# Patient Record
Sex: Male | Born: 1970 | Race: White | Hispanic: Yes | Marital: Married | Smoking: Never smoker
Health system: Southern US, Community
[De-identification: ages and names within clinical notes are randomized; demographics above are authoritative.]

## PROBLEM LIST (undated history)

## (undated) DIAGNOSIS — K648 Other hemorrhoids: Secondary | ICD-10-CM

## (undated) DIAGNOSIS — L719 Rosacea, unspecified: Secondary | ICD-10-CM

## (undated) DIAGNOSIS — IMO0001 Reserved for inherently not codable concepts without codable children: Secondary | ICD-10-CM

## (undated) DIAGNOSIS — H3530 Unspecified macular degeneration: Secondary | ICD-10-CM

## (undated) HISTORY — DX: Other hemorrhoids: K64.8

## (undated) HISTORY — DX: Rosacea, unspecified: L71.9

## (undated) HISTORY — DX: Unspecified macular degeneration: H35.30

## (undated) HISTORY — DX: Reserved for inherently not codable concepts without codable children: IMO0001

---
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
## 1996-02-10 HISTORY — PX: REFRACTIVE SURGERY: SHX103

## 2009-02-09 HISTORY — PX: VASECTOMY: SHX75

## 2010-03-10 ENCOUNTER — Encounter: Payer: Self-pay | Admitting: Internal Medicine

## 2010-03-10 ENCOUNTER — Ambulatory Visit
Admission: RE | Admit: 2010-03-10 | Discharge: 2010-03-10 | Payer: Self-pay | Source: Home / Self Care | Attending: Internal Medicine | Admitting: Internal Medicine

## 2010-03-10 DIAGNOSIS — L719 Rosacea, unspecified: Secondary | ICD-10-CM | POA: Insufficient documentation

## 2010-03-19 ENCOUNTER — Other Ambulatory Visit: Payer: Self-pay

## 2010-03-19 NOTE — Assessment & Plan Note (Signed)
Summary: NEW TO ESTAB///SPH   Vital Signs:  Patient profile:   40 year old male Height:      68.5 inches Weight:      172 pounds BMI:     25.87 O2 Sat:      97 % on Room air Temp:     98.7 degrees F oral Pulse rate:   70 / minute Resp:     16 per minute BP sitting:   90 / 62  (left arm)  Vitals Entered By: Jeremy Johann CMA (March 10, 2010 12:54 PM)  O2 Flow:  Room air CC: new to establish Comments no concerns   History of Present Illness: CPX  for years has episodic R suprapubic pain, usually associated w/ periods of inactivity or wt gain. Was inactive x few weeks, started w/ pain @ R suprapubic area few days ago, not severe  Preventive Screening-Counseling & Management  Alcohol-Tobacco     Alcohol drinks/day: <1     Smoking Status: never  Caffeine-Diet-Exercise     Does Patient Exercise: yes     Times/week: 4      Drug Use:  no.    Current Medications (verified): 1)  None  Allergies (verified): No Known Drug Allergies  Past History:  Past Medical History: Rosacea, usually increased by stress, usually Flagyl topical + by mouth abx   Past Surgical History: Appendectomy-1987 Vasectomy-2011 laser eye surgery-1998  Family History: DM-mom CAD--no Steward Drone colon ca--no prostate ca--no  Social History: original from [place] Married 2 children, [name]  2007 , [name] 2011 Never Smoked Alcohol use-yes Drug use-no Regular exercise--yes Smoking Status:  never Drug Use:  no Does Patient Exercise:  yes  Review of Systems General:  Denies fatigue, fever, and weight loss. CV:  Denies chest pain or discomfort and swelling of feet. Resp:  Denies cough, sputum productive, and wheezing. GI:  Denies abdominal pain, nausea, and vomiting; x 3 years, occasionally sees blood in stools,saw his previous MD, dx w/ small hemorrhoids . GU:  Denies dysuria, hematuria, and urinary hesitancy. Psych:  + recent stress .  Physical Exam  General:  alert,  well-developed, and well-nourished.   Neck:  no masses, no thyromegaly, and no thyroid nodules or tenderness.   Lungs:  normal respiratory effort, no intercostal retractions, no accessory muscle use, and normal breath sounds.   Heart:  normal rate, regular rhythm, no murmur, and no gallop.   Abdomen:  soft, non-tender, no distention, no masses, no guarding, no rigidity, no inguinal hernia, no hepatomegaly, and no splenomegaly.   Genitalia:  uncircumcised, no hydrocele, no varicocele, no cutaneous lesions, and no urethral discharge.     Impression & Recommendations:  Problem # 1:  ROUTINE GENERAL MEDICAL EXAM@HEALTH  CARE FACL (ICD-V70.0) Td  ~2009 flu shot, recommended, states he got it at his job diet-exercise discussed  labs  doing well  EKG WNL  Orders: EKG w/ Interpretation (93000)  Problem # 2:  ROSACEA (ICD-695.3) has rosacea , sporadically  uses flagyl topical +   abx by mouth (minociclin) Rx for flagyl provided , will try doxycycline instead of minociclin  Complete Medication List: 1)  Metronidazole 0.75 % Gel (Metronidazole) .... Apply two times a day for rosacea 2)  Doxycycline Monohydrate 50 Mg Caps (Doxycycline monohydrate) .Marland Kitchen.. 1 by mouth two times a day  Patient Instructions: 1)  came back fasting: 2)  FLP BMP CBC AST ALT TSH----dx V70 3)  Please schedule a follow-up appointment in 1 year.  Prescriptions: DOXYCYCLINE MONOHYDRATE  50 MG CAPS (DOXYCYCLINE MONOHYDRATE) 1 by mouth two times a day  #60 x 3   Entered and Authorized by:   Elita Quick E. Jerremy Maione MD   Signed by:   Nolon Rod. Cataleya Cristina MD on 03/10/2010   Method used:   Print then Give to Patient   RxID:   [id number] METRONIDAZOLE 0.75 % GEL (METRONIDAZOLE) apply two times a day for rosacea  #1 x 6   Entered and Authorized by:   Nolon Rod. Ellen Mayol MD   Signed by:   Nolon Rod. Artin Mceuen MD on 03/10/2010   Method used:   Print then Give to Patient   RxID:   [id number]    Orders Added: 1)  EKG w/ Interpretation [93000] 2)  New  Patient 18-39 years [99385]   Immunization History:  Influenza Immunization History:    Influenza:  rec (03/10/2010)  Tetanus/Td Immunization History:    Tetanus/Td:  historical (02/10/2007)   Immunization History:  Influenza Immunization History:    Influenza:  rec (03/10/2010)  Tetanus/Td Immunization History:    Tetanus/Td:  Historical (02/10/2007)

## 2010-06-05 ENCOUNTER — Encounter: Payer: Self-pay | Admitting: Internal Medicine

## 2010-06-05 ENCOUNTER — Telehealth: Payer: Self-pay | Admitting: Internal Medicine

## 2010-06-05 DIAGNOSIS — L719 Rosacea, unspecified: Secondary | ICD-10-CM

## 2010-06-05 NOTE — Telephone Encounter (Signed)
Pt called, needs dermatology referal. Arrange to see Dr Terri Piedra, Dx rosacea

## 2010-10-06 ENCOUNTER — Encounter: Payer: Self-pay | Admitting: Internal Medicine

## 2010-10-07 ENCOUNTER — Ambulatory Visit (INDEPENDENT_AMBULATORY_CARE_PROVIDER_SITE_OTHER): Payer: BC Managed Care – PPO | Admitting: Internal Medicine

## 2010-10-07 ENCOUNTER — Encounter: Payer: Self-pay | Admitting: Internal Medicine

## 2010-10-07 DIAGNOSIS — R197 Diarrhea, unspecified: Secondary | ICD-10-CM | POA: Insufficient documentation

## 2010-10-07 NOTE — Patient Instructions (Signed)
Take ALIGN (or similar probiotic) 1 tablet a day x 30 days  Bring stools samples: O&P , culture, C diff, WBCs Dx diarrhea  Call if not better in 1 month

## 2010-10-07 NOTE — Assessment & Plan Note (Addendum)
4 weeks ago had what seemed to be typical traveler's diarrhea, since then he has occasionally loose stools. Review of systems is otherwise essentially negative. This could be postinflammatory diarrhea, he also takes on and off antibiotics for acne: C diff?. Plan: Stool studies  probiotics Further workup if not better. As far as his family history of colon cancer, he will need colonoscopy 10 years before the index case. Patient will find out for sure the age of the diagnosis of colon cancer in his uncle

## 2010-10-07 NOTE — Progress Notes (Signed)
  Subjective:    Patient ID: Benjamin Burgess, male    DOB: [birth year], 40 y.o.   MRN: [id number]  HPI 4 weeks ago took a trip to [place] and [place], at the end of the trip he developed moderate to severe watery diarrhea without blood. Since then his stomach is not the same, has on-off loose stools without blood, loose stools aren usually immediately postprandial. Also concerned because one of his uncles was diagnosed with colon cancer at age 13-50  Past Medical History  Diagnosis Date  . Rosacea     Past Surgical History  Procedure Date  . Refractive surgery 1998  . Vasectomy 2011  . Appendectomy 1987     Review of Systems Denies any fever or weight loss Initially had some nausea which is now resolved. No blood in the stools No early satiety, had GERD type of symptoms twice last week but otherwise no GERD sx. No abdominal pain He takes on and off doxycycline for acne, last round of antibiotics about 6 weeks ago appear      Objective:   Physical Exam  Constitutional: He appears well-developed and well-nourished. No distress.  HENT:  Head: Normocephalic and atraumatic.  Eyes: No scleral icterus.  Cardiovascular: Normal rate, regular rhythm and normal heart sounds.   No murmur heard. Pulmonary/Chest: Effort normal and breath sounds normal. No respiratory distress. He has no wheezes. He has no rales.  Abdominal: Soft. Bowel sounds are normal. He exhibits no distension. There is no tenderness. There is no rebound and no guarding.  Musculoskeletal: He exhibits no edema.  Skin: He is not diaphoretic.          Assessment & Plan:

## 2010-10-08 ENCOUNTER — Other Ambulatory Visit: Payer: Self-pay | Admitting: Internal Medicine

## 2010-10-09 LAB — OVA AND PARASITE EXAMINATION

## 2010-10-09 LAB — CLOSTRIDIUM DIFFICILE EIA: CDIFTX: NEGATIVE

## 2010-10-12 LAB — STOOL CULTURE

## 2010-11-10 ENCOUNTER — Ambulatory Visit (INDEPENDENT_AMBULATORY_CARE_PROVIDER_SITE_OTHER): Payer: BC Managed Care – PPO | Admitting: Internal Medicine

## 2010-11-10 ENCOUNTER — Encounter: Payer: Self-pay | Admitting: Internal Medicine

## 2010-11-10 VITALS — BP 106/68 | HR 58 | Temp 98.4°F | Resp 16 | Wt 177.0 lb

## 2010-11-10 DIAGNOSIS — J069 Acute upper respiratory infection, unspecified: Secondary | ICD-10-CM

## 2010-11-10 MED ORDER — AMOXICILLIN 500 MG PO CAPS: 1000.0000 mg | ORAL_CAPSULE | Freq: Two times a day (BID) | ORAL | Status: AC

## 2010-11-10 NOTE — Patient Instructions (Signed)
If  cough, take Mucinex DM twice a day as needed  nasonex 2 puffs on each side of the nose daily until samples gone Take the antibiotic as prescribed if not getting better in few days (Amoxicillin) Call anytime if the symptoms are severe, you have high fever or not well in 10 days

## 2010-11-10 NOTE — Progress Notes (Signed)
  Subjective:    Patient ID: Benjamin Burgess, male    DOB: October 25, 1970, 40 y.o.   MRN: [id number]  HPI Sx started 6 days ago, mild ST, x 1 day has noted yellow nasal d/c , worried b/c  he is leaving town in 2 days  Past Medical History  Diagnosis Date  . Rosacea    Past Surgical History  Procedure Date  . Refractive surgery 1998  . Vasectomy 2011  . Appendectomy 1987    Review of Systems No F/C No N-V Mild nose congestion x 1 - 2 days No myalgias Wife and kids w/ URIs as well    Objective:   Physical Exam  Constitutional: He appears well-developed and well-nourished.  HENT:  Head: Normocephalic and atraumatic.  Right Ear: External ear normal.  Left Ear: External ear normal.       Nose congested, face NTTP, throat w/o redness or d/c   Cardiovascular: Normal rate, regular rhythm and normal heart sounds.   No murmur heard. Pulmonary/Chest: Effort normal and breath sounds normal. No respiratory distress. He has no wheezes. He has no rales.  Musculoskeletal: He exhibits no edema.      Assessment & Plan:  URI: See instructions Likes abx just in case (leaving town)

## 2010-11-15 ENCOUNTER — Other Ambulatory Visit: Payer: Self-pay | Admitting: Emergency Medicine

## 2010-11-15 ENCOUNTER — Ambulatory Visit (INDEPENDENT_AMBULATORY_CARE_PROVIDER_SITE_OTHER)
Admission: RE | Admit: 2010-11-15 | Discharge: 2010-11-15 | Disposition: A | Discharge status: Home / Self Care | Payer: BC Managed Care – PPO | Source: Ambulatory Visit | Attending: Emergency Medicine | Admitting: Emergency Medicine

## 2010-11-15 ENCOUNTER — Ambulatory Visit (HOSPITAL_BASED_OUTPATIENT_CLINIC_OR_DEPARTMENT_OTHER)
Admission: AD | Admit: 2010-11-15 | Discharge: 2010-11-15 | Disposition: A | Discharge status: Home / Self Care | Payer: BC Managed Care – PPO | Source: Ambulatory Visit | Attending: Emergency Medicine | Admitting: Emergency Medicine

## 2010-11-15 DIAGNOSIS — J019 Acute sinusitis, unspecified: Secondary | ICD-10-CM

## 2010-11-15 DIAGNOSIS — R209 Unspecified disturbances of skin sensation: Secondary | ICD-10-CM | POA: Insufficient documentation

## 2010-11-15 DIAGNOSIS — J329 Chronic sinusitis, unspecified: Secondary | ICD-10-CM

## 2010-11-15 DIAGNOSIS — R51 Headache: Secondary | ICD-10-CM

## 2010-11-28 ENCOUNTER — Ambulatory Visit (INDEPENDENT_AMBULATORY_CARE_PROVIDER_SITE_OTHER): Payer: BC Managed Care – PPO | Admitting: Internal Medicine

## 2010-11-28 ENCOUNTER — Encounter: Payer: Self-pay | Admitting: Internal Medicine

## 2010-11-28 VITALS — BP 112/68 | HR 64 | Temp 98.8°F | Resp 14 | Wt 178.5 lb

## 2010-11-28 DIAGNOSIS — R51 Headache: Secondary | ICD-10-CM

## 2010-11-28 DIAGNOSIS — R519 Headache, unspecified: Secondary | ICD-10-CM | POA: Insufficient documentation

## 2010-11-28 NOTE — Assessment & Plan Note (Addendum)
Episodes of severe cephalgia 2 weeks ago, by description sounded like a neuralgia, now complaining of a mild headache and nuchal ache. Patient likes to be completely sure it is not something serious, to accomplish that, we need to do MRI MRA to ensure the CAT scan did not miss a subarachnoid bleed. If the MRI is negative, headache may be tensional,   neuralgia, or cluster. ER if sx severe

## 2010-11-28 NOTE — Progress Notes (Signed)
  Subjective:    Patient ID: Benjamin Burgess, male    DOB: 1970-06-08, 40 y.o.   MRN: [id number]  HPI 2 weeks ago, while flying back to [place], he had a stabbing, quite intense, electricity-like pain at the left side of scalp and L  face. No associated nausea, diplopia, dizzines  or a rash. Symptoms lasted a couple of minutes and then they were gone. He was left with a mild headache. Went to urgent care on 11-15-10, CT was ordered, did not show any intracranial bleeding, it showed mild sinusitis, he was prescribed antibiotics for 7 days. He was okay until yesterday when he again developed a mild headache that radiates from the top of the head to the nuchal area . Very concerned about the sx   Past Medical History  Diagnosis Date  . Rosacea   . Migraine    Past Surgical History  Procedure Date  . Refractive surgery 1998  . Vasectomy 2011  . Appendectomy 1987    Review of Systems Denies fevers chills No slurred speech, motor deficits, diplopia denies any sinus infection type of symptoms. He has a history of migraines, current  headaches are completely different from previous migraines.    Objective:   Physical Exam  Constitutional: He is oriented to person, place, and time. He appears well-developed and well-nourished.  HENT:  Head: Normocephalic and atraumatic.  Right Ear: External ear normal.  Left Ear: External ear normal.  Nose: Nose normal.       Face symmetric, not tender to palpation  Eyes: EOM are normal. Pupils are equal, round, and reactive to light.  Neck: Normal range of motion. Neck supple.  Cardiovascular: Normal rate, regular rhythm and normal heart sounds.   No murmur heard. Pulmonary/Chest: Effort normal and breath sounds normal. No respiratory distress. He has no wheezes. He has no rales. He exhibits no tenderness.  Neurological: He is alert and oriented to person, place, and time.       Speech, gait, motor are intact. DTRs symmetric  Psychiatric: He has a normal  mood and affect. His behavior is normal. Judgment and thought content normal.       Assessment & Plan:

## 2010-11-29 ENCOUNTER — Ambulatory Visit
Admission: RE | Admit: 2010-11-29 | Discharge: 2010-11-29 | Disposition: A | Discharge status: Home / Self Care | Payer: BC Managed Care – PPO | Source: Ambulatory Visit | Attending: Internal Medicine | Admitting: Internal Medicine

## 2010-11-29 DIAGNOSIS — R51 Headache: Secondary | ICD-10-CM

## 2011-01-06 ENCOUNTER — Ambulatory Visit (INDEPENDENT_AMBULATORY_CARE_PROVIDER_SITE_OTHER): Payer: BC Managed Care – PPO | Admitting: Internal Medicine

## 2011-01-06 ENCOUNTER — Encounter: Payer: Self-pay | Admitting: Internal Medicine

## 2011-01-06 VITALS — BP 100/60 | HR 76 | Temp 97.7°F | Resp 16

## 2011-01-06 DIAGNOSIS — J069 Acute upper respiratory infection, unspecified: Secondary | ICD-10-CM

## 2011-01-06 MED ORDER — AZITHROMYCIN 250 MG PO TABS: ORAL_TABLET | ORAL | Status: AC

## 2011-01-07 DIAGNOSIS — J069 Acute upper respiratory infection, unspecified: Secondary | ICD-10-CM | POA: Insufficient documentation

## 2011-01-07 NOTE — Assessment & Plan Note (Signed)
Consider possible viral etiology currently. Attempt otc medication prn. Given abx prescription to hold. Begin abx if no improvement of sx's after total duration of 7-10 days. Followup if no improvement or worsening.

## 2011-01-07 NOTE — Progress Notes (Signed)
  Subjective:    Patient ID: Benjamin Burgess, male    DOB: Mar 02, 1970, 40 y.o.   MRN: [id number]  HPI Pt presents to clinic for evaluation of cough. Notes cough productive for yellow sputum, sinus congestion without f/c or hemoptysis. +sick exposure at home. No alleviating or exacerbating factors. No other complaints.  Past Medical History  Diagnosis Date  . Rosacea   . Migraine    Past Surgical History  Procedure Date  . Refractive surgery 1998  . Vasectomy 2011  . Appendectomy 1987    reports that he has never smoked. He has never used smokeless tobacco. He reports that he drinks alcohol. He reports that he does not use illicit drugs. family history includes Diabetes in his mother. No Known Allergies   Review of Systems see hpi     Objective:   Physical Exam  Nursing note and vitals reviewed. Constitutional: He appears well-developed and well-nourished. No distress.  HENT:  Head: Normocephalic and atraumatic.  Right Ear: Tympanic membrane, external ear and ear canal normal.  Left Ear: Tympanic membrane, external ear and ear canal normal.  Nose: Nose normal.  Mouth/Throat: Oropharynx is clear and moist. No oropharyngeal exudate.  Eyes: Conjunctivae are normal. Right eye exhibits no discharge. Left eye exhibits no discharge. No scleral icterus.  Neck: Neck supple.  Cardiovascular: Normal rate, regular rhythm and normal heart sounds.  Exam reveals no gallop and no friction rub.   No murmur heard. Pulmonary/Chest: Effort normal and breath sounds normal. No respiratory distress. He has no wheezes. He has no rales.  Lymphadenopathy:    He has no cervical adenopathy.  Neurological: He is alert.  Skin: Skin is warm and dry. He is not diaphoretic.  Psychiatric: He has a normal mood and affect.          Assessment & Plan:

## 2011-05-26 ENCOUNTER — Telehealth: Payer: Self-pay | Admitting: Internal Medicine

## 2011-05-26 NOTE — Telephone Encounter (Signed)
Schedule a CPX (30 minutes)

## 2011-05-26 NOTE — Telephone Encounter (Signed)
Please advise 

## 2011-05-26 NOTE — Telephone Encounter (Signed)
Patient is requesting the following in back to back slots to accommodate his schedule 1-4minute appointment for toe fungus 1-15 minute appointment for a prostate exam  1-30 minute CPE appointment.  Currently your scheduled does not have the provided slots available for this request Please review & advise and I will call patient back to schedule  Patient pH# [phone] Patient does request the following days Wednesday or Friday of this week or 5/2, 5/3

## 2011-07-17 ENCOUNTER — Ambulatory Visit (INDEPENDENT_AMBULATORY_CARE_PROVIDER_SITE_OTHER): Payer: BC Managed Care – PPO | Admitting: Internal Medicine

## 2011-07-17 ENCOUNTER — Encounter: Payer: Self-pay | Admitting: Internal Medicine

## 2011-07-17 VITALS — BP 108/70 | HR 56 | Temp 98.4°F | Resp 12 | Ht 69.03 in | Wt 183.0 lb

## 2011-07-17 DIAGNOSIS — Z0000 Encounter for general adult medical examination without abnormal findings: Secondary | ICD-10-CM | POA: Insufficient documentation

## 2011-07-17 LAB — CBC WITH DIFFERENTIAL/PLATELET
Basophils Relative: 0.5 % (ref 0.0–3.0)
Eosinophils Relative: 2.3 % (ref 0.0–5.0)
HCT: 46 % (ref 39.0–52.0)
Lymphs Abs: 1.4 10*3/uL (ref 0.7–4.0)
MCHC: 33.3 g/dL (ref 30.0–36.0)
MCV: 92.8 fl (ref 78.0–100.0)
Monocytes Absolute: 0.4 10*3/uL (ref 0.1–1.0)
Platelets: 205 10*3/uL (ref 150.0–400.0)
RBC: 4.95 Mil/uL (ref 4.22–5.81)
WBC: 4.9 10*3/uL (ref 4.5–10.5)

## 2011-07-17 LAB — PSA: PSA: 0.54 ng/mL (ref 0.10–4.00)

## 2011-07-17 LAB — LIPID PANEL
LDL Cholesterol: 103 mg/dL — ABNORMAL HIGH (ref 0–99)
Total CHOL/HDL Ratio: 5

## 2011-07-17 LAB — COMPREHENSIVE METABOLIC PANEL
AST: 34 U/L (ref 0–37)
Alkaline Phosphatase: 87 U/L (ref 39–117)
Glucose, Bld: 93 mg/dL (ref 70–99)
Sodium: 138 mEq/L (ref 135–145)
Total Bilirubin: 0.6 mg/dL (ref 0.3–1.2)
Total Protein: 6.6 g/dL (ref 6.0–8.3)

## 2011-07-17 NOTE — Progress Notes (Signed)
  Subjective:    Patient ID: Benjamin Burgess, male    DOB: Mar 10, 1970, 41 y.o.   MRN: [id number]  HPI CPX  Past Medical History: Rosacea, usually increased by stress, uses Flagyl topical + by mouth abx sporadically   Past Surgical History: Appendectomy-1987 Vasectomy-2011 laser eye surgery-1998  Family History: DM-mom CAD--no Steward Drone colon ca--aunt and uncle w/ colon ca, uncle dx in his 36s prostate ca--no  Social History: original from [place], moved to [place] 2008 Married, 2 children, [name]  2007 , [name] 2011 Never Smoked Alcohol use-yes Drug use-no Regular exercise--gym x 2/ week, tennis x 1/week Diet-- regular, trying to eat healthier lately    Review of Systems Patient is doing great. He is concerned about his weight, gaining 10 pounds in the last year despite trying to eat healthier in the last few months. No chest pain or shortness of breath No nausea, vomiting, diarrhea. No blood in the stools. No dysuria, difficulty urinating or gross hematuria. Some stress but no anxiety or depression.     Objective:   Physical Exam  General -- alert, well-developed, and well-nourished.   Neck --no thyromegaly , normal carotid pulse Lungs -- normal respiratory effort, no intercostal retractions, no accessory muscle use, and normal breath sounds.   Heart-- normal rate, regular rhythm, no murmur, and no gallop.   Abdomen--soft, non-tender, no distention, no masses, no HSM, no guarding, and no rigidity.   Extremities-- no pretibial edema bilaterally Neurologic-- alert & oriented X3 and strength normal in all extremities. Psych-- Cognition and judgment appear intact. Alert and cooperative with normal attention span and concentration.  not anxious appearing and not depressed appearing.       Assessment & Plan:

## 2011-07-17 NOTE — Assessment & Plan Note (Addendum)
Td 2009 He is very active physically. He has gained 10 pounds in the last year, his diet is not too bad but there is room for improvement . Recommend either a dietitian referral or to self education with a book such as [employer]. he agreed to do a self education. Will call if needs more help. He has a family history of colon cancer, request a colonoscopy. Referral done. Labs,  PSA per pt request.

## 2011-07-18 ENCOUNTER — Encounter: Payer: Self-pay | Admitting: Internal Medicine

## 2011-07-21 ENCOUNTER — Encounter: Payer: Self-pay | Admitting: Internal Medicine

## 2011-08-17 ENCOUNTER — Telehealth: Payer: Self-pay | Admitting: *Deleted

## 2011-08-17 ENCOUNTER — Ambulatory Visit (AMBULATORY_SURGERY_CENTER): Payer: BC Managed Care – PPO | Admitting: *Deleted

## 2011-08-17 VITALS — Ht 68.0 in | Wt 181.3 lb

## 2011-08-17 DIAGNOSIS — Z800 Family history of malignant neoplasm of digestive organs: Secondary | ICD-10-CM

## 2011-08-17 DIAGNOSIS — Z1211 Encounter for screening for malignant neoplasm of colon: Secondary | ICD-10-CM

## 2011-08-17 MED ORDER — MOVIPREP 100 G PO SOLR: ORAL | Status: DC

## 2011-08-17 NOTE — Telephone Encounter (Signed)
Dr. Leone Payor: Pt is 41 years old; hispanic.  He has family hx of colon cancer in maternal aunt and maternal uncle; both diagnosed in late 70's.  Pt is not having any GI symptoms. Is pt due for screening colonoscopy now?  Thanks, Olegario Messier

## 2011-08-18 ENCOUNTER — Encounter: Payer: Self-pay | Admitting: Internal Medicine

## 2011-08-18 NOTE — Telephone Encounter (Signed)
I have reviewed the guidelines to be sure and in his case (2 second-degree relatives with colon cancer) - his screening should begin at age 41 and treated as average risk.  I am happy to discuss this with him in the office but do not think he should proceed to colonoscopy at this time, based upon the available information.  I am copying his PCP (the patient requested the colonoscopy)

## 2011-08-18 NOTE — Telephone Encounter (Signed)
Spoke with patient and advised him, per Dr Leone Payor he did not need a screening colonoscopy until age 41 and we will cancel his appt on 09/03/11 with Dr Leone Payor.The pt will call if he has further questions.Ulis Rias RN

## 2011-08-28 ENCOUNTER — Encounter: Payer: BC Managed Care – PPO | Admitting: Internal Medicine

## 2011-09-03 ENCOUNTER — Encounter: Payer: BC Managed Care – PPO | Admitting: Internal Medicine

## 2011-11-11 ENCOUNTER — Other Ambulatory Visit: Payer: Self-pay | Admitting: Internal Medicine

## 2011-11-12 MED ORDER — METRONIDAZOLE 0.75 % EX GEL: Freq: Two times a day (BID) | CUTANEOUS | Status: DC

## 2011-11-12 NOTE — Progress Notes (Signed)
Pt requested Rf on acne meds, sent. States may needs abx po, he will call if needed

## 2012-02-16 ENCOUNTER — Encounter (INDEPENDENT_AMBULATORY_CARE_PROVIDER_SITE_OTHER): Payer: BC Managed Care – PPO | Admitting: Ophthalmology

## 2012-02-16 DIAGNOSIS — H43819 Vitreous degeneration, unspecified eye: Secondary | ICD-10-CM

## 2012-02-16 DIAGNOSIS — H3530 Unspecified macular degeneration: Secondary | ICD-10-CM

## 2012-02-16 DIAGNOSIS — H353290 Exudative age-related macular degeneration, unspecified eye, stage unspecified: Secondary | ICD-10-CM

## 2012-02-19 ENCOUNTER — Ambulatory Visit (INDEPENDENT_AMBULATORY_CARE_PROVIDER_SITE_OTHER): Payer: BC Managed Care – PPO | Admitting: Ophthalmology

## 2012-02-19 DIAGNOSIS — H353290 Exudative age-related macular degeneration, unspecified eye, stage unspecified: Secondary | ICD-10-CM

## 2012-02-19 DIAGNOSIS — H43819 Vitreous degeneration, unspecified eye: Secondary | ICD-10-CM

## 2012-02-19 DIAGNOSIS — H2510 Age-related nuclear cataract, unspecified eye: Secondary | ICD-10-CM

## 2012-02-19 DIAGNOSIS — H4420 Degenerative myopia, unspecified eye: Secondary | ICD-10-CM

## 2012-02-19 DIAGNOSIS — H3530 Unspecified macular degeneration: Secondary | ICD-10-CM

## 2012-03-15 ENCOUNTER — Encounter (INDEPENDENT_AMBULATORY_CARE_PROVIDER_SITE_OTHER): Payer: BC Managed Care – PPO | Admitting: Ophthalmology

## 2012-03-15 DIAGNOSIS — H353290 Exudative age-related macular degeneration, unspecified eye, stage unspecified: Secondary | ICD-10-CM

## 2012-03-15 DIAGNOSIS — H43819 Vitreous degeneration, unspecified eye: Secondary | ICD-10-CM

## 2012-03-15 DIAGNOSIS — H3530 Unspecified macular degeneration: Secondary | ICD-10-CM

## 2012-03-15 DIAGNOSIS — H4420 Degenerative myopia, unspecified eye: Secondary | ICD-10-CM

## 2012-03-26 ENCOUNTER — Other Ambulatory Visit: Payer: Self-pay

## 2012-04-19 ENCOUNTER — Encounter (INDEPENDENT_AMBULATORY_CARE_PROVIDER_SITE_OTHER): Payer: BC Managed Care – PPO | Admitting: Ophthalmology

## 2012-04-19 DIAGNOSIS — H3530 Unspecified macular degeneration: Secondary | ICD-10-CM

## 2012-04-19 DIAGNOSIS — I10 Essential (primary) hypertension: Secondary | ICD-10-CM

## 2012-04-19 DIAGNOSIS — H35039 Hypertensive retinopathy, unspecified eye: Secondary | ICD-10-CM

## 2012-04-19 DIAGNOSIS — H353290 Exudative age-related macular degeneration, unspecified eye, stage unspecified: Secondary | ICD-10-CM

## 2012-04-19 DIAGNOSIS — H43819 Vitreous degeneration, unspecified eye: Secondary | ICD-10-CM

## 2012-05-16 ENCOUNTER — Encounter (INDEPENDENT_AMBULATORY_CARE_PROVIDER_SITE_OTHER): Payer: BC Managed Care – PPO | Admitting: Ophthalmology

## 2012-05-16 DIAGNOSIS — H43819 Vitreous degeneration, unspecified eye: Secondary | ICD-10-CM

## 2012-05-16 DIAGNOSIS — H3530 Unspecified macular degeneration: Secondary | ICD-10-CM

## 2012-05-16 DIAGNOSIS — H4420 Degenerative myopia, unspecified eye: Secondary | ICD-10-CM

## 2012-05-16 DIAGNOSIS — H353290 Exudative age-related macular degeneration, unspecified eye, stage unspecified: Secondary | ICD-10-CM

## 2012-06-20 ENCOUNTER — Encounter (INDEPENDENT_AMBULATORY_CARE_PROVIDER_SITE_OTHER): Payer: BC Managed Care – PPO | Admitting: Ophthalmology

## 2012-06-20 DIAGNOSIS — H4420 Degenerative myopia, unspecified eye: Secondary | ICD-10-CM

## 2012-06-20 DIAGNOSIS — H353290 Exudative age-related macular degeneration, unspecified eye, stage unspecified: Secondary | ICD-10-CM

## 2012-06-20 DIAGNOSIS — H43819 Vitreous degeneration, unspecified eye: Secondary | ICD-10-CM

## 2012-06-20 DIAGNOSIS — H3530 Unspecified macular degeneration: Secondary | ICD-10-CM

## 2012-06-21 ENCOUNTER — Encounter (INDEPENDENT_AMBULATORY_CARE_PROVIDER_SITE_OTHER): Payer: BC Managed Care – PPO | Admitting: Ophthalmology

## 2012-06-28 ENCOUNTER — Telehealth: Payer: Self-pay | Admitting: *Deleted

## 2012-06-28 DIAGNOSIS — Z0000 Encounter for general adult medical examination without abnormal findings: Secondary | ICD-10-CM

## 2012-06-28 NOTE — Telephone Encounter (Signed)
Lab orders entered

## 2012-06-28 NOTE — Telephone Encounter (Signed)
Message copied by Nada Maclachlan on Tue Jun 28, 2012  1:07 PM ------      Message from: Wanda Plump      Created: Tue Jun 28, 2012 12:14 PM       CMP, CBC, FLP, TSH -- dx v70      ----- Message -----         From: Nada Maclachlan, MA         Sent: 06/28/2012   8:41 AM           To: Wanda Plump, MD             What orders do i need to enter?      ----- Message -----         From: Marshell Garfinkel         Sent: 06/28/2012   8:17 AM           To: Nada Maclachlan, MA            Patient would like to have labs prior to his cpe on Thursday. Can you place orders please?             ------

## 2012-06-29 ENCOUNTER — Other Ambulatory Visit (INDEPENDENT_AMBULATORY_CARE_PROVIDER_SITE_OTHER): Payer: BC Managed Care – PPO

## 2012-06-29 DIAGNOSIS — Z0000 Encounter for general adult medical examination without abnormal findings: Secondary | ICD-10-CM

## 2012-06-29 LAB — COMPREHENSIVE METABOLIC PANEL
ALT: 28 U/L (ref 0–53)
AST: 32 U/L (ref 0–37)
Alkaline Phosphatase: 68 U/L (ref 39–117)
Sodium: 139 mEq/L (ref 135–145)
Total Bilirubin: 0.7 mg/dL (ref 0.3–1.2)
Total Protein: 7 g/dL (ref 6.0–8.3)

## 2012-06-29 LAB — CBC WITH DIFFERENTIAL/PLATELET
Eosinophils Relative: 3.9 % (ref 0.0–5.0)
HCT: 43.2 % (ref 39.0–52.0)
Lymphs Abs: 1.4 10*3/uL (ref 0.7–4.0)
MCV: 92.4 fl (ref 78.0–100.0)
Monocytes Absolute: 0.5 10*3/uL (ref 0.1–1.0)
Platelets: 229 10*3/uL (ref 150.0–400.0)
RDW: 13.6 % (ref 11.5–14.6)
WBC: 4.8 10*3/uL (ref 4.5–10.5)

## 2012-06-29 LAB — LIPID PANEL
Cholesterol: 142 mg/dL (ref 0–200)
LDL Cholesterol: 87 mg/dL (ref 0–99)

## 2012-06-30 ENCOUNTER — Ambulatory Visit (INDEPENDENT_AMBULATORY_CARE_PROVIDER_SITE_OTHER): Payer: BC Managed Care – PPO | Admitting: Internal Medicine

## 2012-06-30 VITALS — BP 110/72 | HR 59 | Temp 98.1°F | Ht 68.5 in | Wt 175.0 lb

## 2012-06-30 DIAGNOSIS — Z0000 Encounter for general adult medical examination without abnormal findings: Secondary | ICD-10-CM

## 2012-06-30 NOTE — Progress Notes (Signed)
  Subjective:    Patient ID: Benjamin Burgess, male    DOB: [birth year], 42 y.o.   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 Patient is concerned because started when he was running. He is able to run 21 Km without any problems. No cough, wheezing, shortness or breath. No nausea, vomiting, diarrhea or heartburn. No dysuria gross hematuria.     Objective:   Physical Exam  Musculoskeletal:       Arms:  BP 110/72  Pulse 59  Temp(Src) 98.1 F (36.7 C) (Oral)  Ht 5' 8.5" (1.74 m)  Wt 175 lb (79.379 kg)  BMI 26.22 kg/m2  SpO2 97%  General -- alert, well-developed, NAD  Neck --no thyromegaly , normal carotid pulse Lungs -- normal respiratory effort, no intercostal retractions, no accessory muscle use, and normal breath sounds.   Heart-- normal rate, regular rhythm, no murmur, and no gallop.   Abdomen--soft, non-tender, no distention, no masses, no HSM, no guarding, and no rigidity.   Extremities-- no pretibial edema bilaterally Neurologic-- alert & oriented X3 and strength normal in all extremities. Psych-- Cognition and judgment appear intact. Alert and cooperative with normal attention span and concentration.  not anxious appearing and not depressed appearing.          Assessment & Plan:

## 2012-06-30 NOTE — Assessment & Plan Note (Addendum)
Td 2009 Has an extremely healthy lifestyle He has a family history of colon cancer, was recommended by GI to start a colonoscopy at age 42 Has atypical chest pain, likely musculoskeletal, recommend observation. All recent labs reviewed with the patient, cholesterol panel showed a significant improvement compared to last year likely due to to his healthier lifestyle. Next visit one year

## 2012-07-01 ENCOUNTER — Encounter: Payer: Self-pay | Admitting: Internal Medicine

## 2012-07-07 ENCOUNTER — Encounter: Payer: BC Managed Care – PPO | Admitting: Internal Medicine

## 2012-07-29 ENCOUNTER — Encounter (INDEPENDENT_AMBULATORY_CARE_PROVIDER_SITE_OTHER): Payer: BC Managed Care – PPO | Admitting: Ophthalmology

## 2012-07-29 DIAGNOSIS — H4420 Degenerative myopia, unspecified eye: Secondary | ICD-10-CM

## 2012-07-29 DIAGNOSIS — H353290 Exudative age-related macular degeneration, unspecified eye, stage unspecified: Secondary | ICD-10-CM

## 2012-07-29 DIAGNOSIS — H3530 Unspecified macular degeneration: Secondary | ICD-10-CM

## 2012-07-29 DIAGNOSIS — H43819 Vitreous degeneration, unspecified eye: Secondary | ICD-10-CM

## 2012-08-01 ENCOUNTER — Encounter (INDEPENDENT_AMBULATORY_CARE_PROVIDER_SITE_OTHER): Payer: BC Managed Care – PPO | Admitting: Ophthalmology

## 2012-08-04 ENCOUNTER — Encounter: Payer: BC Managed Care – PPO | Admitting: Internal Medicine

## 2012-08-15 ENCOUNTER — Encounter: Payer: Self-pay | Admitting: Internal Medicine

## 2012-08-15 ENCOUNTER — Ambulatory Visit (INDEPENDENT_AMBULATORY_CARE_PROVIDER_SITE_OTHER): Payer: BC Managed Care – PPO | Admitting: Internal Medicine

## 2012-08-15 VITALS — BP 115/70 | HR 70 | Temp 97.8°F | Wt 175.6 lb

## 2012-08-15 DIAGNOSIS — N390 Urinary tract infection, site not specified: Secondary | ICD-10-CM

## 2012-08-15 DIAGNOSIS — R350 Frequency of micturition: Secondary | ICD-10-CM

## 2012-08-15 LAB — POCT URINALYSIS DIPSTICK
Ketones, UA: NEGATIVE
Leukocytes, UA: NEGATIVE
Nitrite, UA: NEGATIVE
Protein, UA: NEGATIVE
Urobilinogen, UA: 0.2

## 2012-08-15 MED ORDER — CIPROFLOXACIN HCL 500 MG PO TABS: 500.0000 mg | ORAL_TABLET | Freq: Two times a day (BID) | ORAL | Status: DC

## 2012-08-15 NOTE — Patient Instructions (Addendum)
Drink plenty of fluids Avoid excessive sun exposure Call if not back to normal in 2 weeks Call if symptoms severe

## 2012-08-15 NOTE — Progress Notes (Signed)
  Subjective:    Patient ID: Benjamin Burgess, male    DOB: [birth year], 42 y.o.   MRN: [id number]  HPI Acute visit, UTI?Marland Kitchen Symptoms started 4 days ago:  burning  feeling at the end of the penis (end of the urethra) and urinary frequency. Normally, he urinates 4 times a day, now is 8 times a day. Penile discomfort intensifies with urination and bowel movements. Denies any penile discharge or any high-risk behavior. 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 No gross hematuria. Minimal right-sided suprapubic discomfort from time to time . No GU rash  No h/o previous UTI-herpes    Objective:   Physical Exam BP 115/70  Pulse 70  Temp(Src) 97.8 F (36.6 C) (Oral)  Wt 175 lb 9.6 oz (79.652 kg)  BMI 26.31 kg/m2  SpO2 97%  General -- alert, well-developed, NAD.    Abdomen--soft, non-tender, no distention, no masses, no HSM, no guarding, and no rigidity.  No CVA tenderness Extremities-- no pretibial edema bilaterally Rectal-- single skin tag noted. Normal sphincter tone. No rectal masses or tenderness. Brown stool Prostate:  Prostate gland firm and smooth, no enlargement, nodularity, mass, asymmetry or induration. No tenderness, symptoms did intensify during examination. GU: Penis without lesions or discharge, scrotal contents normal, no LADs in the groin. Psych-- Cognition and  judgment appear intact. Alert and cooperative with normal attention span and concentration.  not anxious appearing and not depressed appearing.       Assessment & Plan:  UTI? Presents with dysuria as described above, DDX includes UTI, mild prostatitis, herpes? (No rash noted), urethritis. Udip + For blood. Plan: Urine culture, and G&C although doubt urethritis Empiric Cipro Avoid excessive sun exposure show

## 2012-08-17 LAB — GC/CHLAMYDIA PROBE AMP, URINE: GC Probe Amp, Urine: NEGATIVE

## 2012-08-17 LAB — URINE CULTURE: Colony Count: NO GROWTH

## 2012-09-16 ENCOUNTER — Encounter (INDEPENDENT_AMBULATORY_CARE_PROVIDER_SITE_OTHER): Payer: BC Managed Care – PPO | Admitting: Ophthalmology

## 2012-09-16 DIAGNOSIS — H353290 Exudative age-related macular degeneration, unspecified eye, stage unspecified: Secondary | ICD-10-CM

## 2012-09-16 DIAGNOSIS — H43819 Vitreous degeneration, unspecified eye: Secondary | ICD-10-CM

## 2012-09-16 DIAGNOSIS — H4420 Degenerative myopia, unspecified eye: Secondary | ICD-10-CM

## 2012-09-16 DIAGNOSIS — H3530 Unspecified macular degeneration: Secondary | ICD-10-CM

## 2012-09-16 DIAGNOSIS — H2510 Age-related nuclear cataract, unspecified eye: Secondary | ICD-10-CM

## 2012-10-24 ENCOUNTER — Ambulatory Visit (INDEPENDENT_AMBULATORY_CARE_PROVIDER_SITE_OTHER): Payer: BC Managed Care – PPO | Admitting: Nurse Practitioner

## 2012-10-24 ENCOUNTER — Encounter: Payer: Self-pay | Admitting: Nurse Practitioner

## 2012-10-24 VITALS — BP 116/77 | HR 64 | Temp 98.5°F | Ht 68.5 in | Wt 178.8 lb

## 2012-10-24 DIAGNOSIS — B9689 Other specified bacterial agents as the cause of diseases classified elsewhere: Secondary | ICD-10-CM

## 2012-10-24 DIAGNOSIS — J019 Acute sinusitis, unspecified: Secondary | ICD-10-CM

## 2012-10-24 MED ORDER — AMOXICILLIN-POT CLAVULANATE 875-125 MG PO TABS: 1.0000 | ORAL_TABLET | Freq: Two times a day (BID) | ORAL | Status: DC

## 2012-10-24 NOTE — Progress Notes (Signed)
  Subjective:    Patient ID: Benjamin Burgess, male    DOB: Jun 16, 1970, 42 y.o.   MRN: [id number]  Sinusitis This is a new (pt has had nasal congestion & cough for 1 month, feeling fatigued.) problem. The current episode started more than 1 month ago. The problem has been waxing and waning since onset. There has been no fever. The pain is mild (occasional HA back of head). Associated symptoms include congestion, coughing, headaches and a hoarse voice. Pertinent negatives include no chills, ear pain, neck pain, shortness of breath, sinus pressure or sore throat. Past treatments include nothing.      Review of Systems  Constitutional: Positive for activity change (slept a lot this weekend due to fatigue) and fatigue. Negative for fever, chills and appetite change.  HENT: Positive for congestion, hoarse voice, voice change and postnasal drip. Negative for hearing loss, ear pain, sore throat, neck pain, neck stiffness and sinus pressure.   Eyes: Negative for redness.  Respiratory: Positive for cough. Negative for shortness of breath and wheezing.   Cardiovascular: Negative for chest pain.  Gastrointestinal: Negative for abdominal pain.  Skin: Negative for rash.  Allergic/Immunologic: Negative for environmental allergies.  Neurological: Positive for headaches. Negative for dizziness.  Hematological: Negative for adenopathy.       Objective:   Physical Exam  Vitals reviewed. Constitutional: He is oriented to person, place, and time. He appears well-developed and well-nourished. No distress.  HENT:  Head: Normocephalic and atraumatic.  Right Ear: External ear normal.  Left Ear: External ear normal.  Mouth/Throat: Oropharynx is clear and moist. No oropharyngeal exudate.  bilat TM retracted. Tonsils +2 bilat, no exudate.  Eyes: Conjunctivae are normal. Right eye exhibits no discharge. Left eye exhibits no discharge.  Neck: Normal range of motion. Neck supple. No tracheal deviation present. No  thyromegaly present.  Cardiovascular: Normal rate, regular rhythm and normal heart sounds.   No murmur heard. Pulmonary/Chest: Breath sounds normal. He is in respiratory distress. He has no wheezes.  Lymphadenopathy:    He has no cervical adenopathy.  Neurological: He is alert and oriented to person, place, and time.  Skin: Skin is dry. No rash noted.  Psychiatric: He has a normal mood and affect. His behavior is normal. Thought content normal.          Assessment & Plan:  1. Acute bacterial sinusitis  - amoxicillin-clavulanate (AUGMENTIN) 875-125 MG per tablet; Take 1 tablet by mouth 2 (two) times daily.  Dispense: 14 tablet; Refill: 0 See pt instructions.

## 2012-10-24 NOTE — Patient Instructions (Addendum)
You likely have had a viral illness that has become a bacterial sinusitis. Start antibiotic as directed. .Start sinus washes daily (Neilmed sinus wash). Also take pseudoephedrine daily to help dry sinuses. Eat yogurt daily while on antibiotic to help prevent diarrhea that can be caused by antibiotic use. Rest. Resume usual activities when fatigue resolves. Please call for re-eval if you begin to run fever or fatigue does not resolve in the next 10 days. Feel better!   Sinusitis Sinusitis is redness, soreness, and swelling (inflammation) of the paranasal sinuses. Paranasal sinuses are air pockets within the bones of your face (beneath the eyes, the middle of the forehead, or above the eyes). In healthy paranasal sinuses, mucus is able to drain out, and air is able to circulate through them by way of your nose. However, when your paranasal sinuses are inflamed, mucus and air can become trapped. This can allow bacteria and other germs to grow and cause infection. Sinusitis can develop quickly and last only a short time (acute) or continue over a long period (chronic). Sinusitis that lasts for more than 12 weeks is considered chronic.  CAUSES  Causes of sinusitis include:  Allergies.  Structural abnormalities, such as displacement of the cartilage that separates your nostrils (deviated septum), which can decrease the air flow through your nose and sinuses and affect sinus drainage.  Functional abnormalities, such as when the small hairs (cilia) that line your sinuses and help remove mucus do not work properly or are not present. SYMPTOMS  Symptoms of acute and chronic sinusitis are the same. The primary symptoms are pain and pressure around the affected sinuses. Other symptoms include:  Upper toothache.  Earache.  Headache.  Bad breath.  Decreased sense of smell and taste.  A cough, which worsens when you are lying flat.  Fatigue.  Fever.  Thick drainage from your nose, which often is  green and may contain pus (purulent).  Swelling and warmth over the affected sinuses. DIAGNOSIS  Your caregiver will perform a physical exam. During the exam, your caregiver may:  Look in your nose for signs of abnormal growths in your nostrils (nasal polyps).  Tap over the affected sinus to check for signs of infection.  View the inside of your sinuses (endoscopy) with a special imaging device with a light attached (endoscope), which is inserted into your sinuses. If your caregiver suspects that you have chronic sinusitis, one or more of the following tests may be recommended:  Allergy tests.  Nasal culture A sample of mucus is taken from your nose and sent to a lab and screened for bacteria.  Nasal cytology A sample of mucus is taken from your nose and examined by your caregiver to determine if your sinusitis is related to an allergy. TREATMENT  Most cases of acute sinusitis are related to a viral infection and will resolve on their own within 10 days. Sometimes medicines are prescribed to help relieve symptoms (pain medicine, decongestants, nasal steroid sprays, or saline sprays).  However, for sinusitis related to a bacterial infection, your caregiver will prescribe antibiotic medicines. These are medicines that will help kill the bacteria causing the infection.  Rarely, sinusitis is caused by a fungal infection. In theses cases, your caregiver will prescribe antifungal medicine. For some cases of chronic sinusitis, surgery is needed. Generally, these are cases in which sinusitis recurs more than 3 times per year, despite other treatments. HOME CARE INSTRUCTIONS   Drink plenty of water. Water helps thin the mucus so your sinuses  can drain more easily.  Use a humidifier.  Inhale steam 3 to 4 times a day (for example, sit in the bathroom with the shower running).  Apply a warm, moist washcloth to your face 3 to 4 times a day, or as directed by your caregiver.  Use saline nasal  sprays to help moisten and clean your sinuses.  Take over-the-counter or prescription medicines for pain, discomfort, or fever only as directed by your caregiver. SEEK IMMEDIATE MEDICAL CARE IF:  You have increasing pain or severe headaches.  You have nausea, vomiting, or drowsiness.  You have swelling around your face.  You have vision problems.  You have a stiff neck.  You have difficulty breathing. MAKE SURE YOU:   Understand these instructions.  Will watch your condition.  Will get help right away if you are not doing well or get worse. Document Released: 01/26/2005 Document Revised: 04/20/2011 Document Reviewed: 02/10/2011 [facility] Patient Information 2014 [place], [place].

## 2012-10-28 ENCOUNTER — Encounter (INDEPENDENT_AMBULATORY_CARE_PROVIDER_SITE_OTHER): Payer: BC Managed Care – PPO | Admitting: Ophthalmology

## 2012-10-28 DIAGNOSIS — H3530 Unspecified macular degeneration: Secondary | ICD-10-CM

## 2012-10-28 DIAGNOSIS — H4420 Degenerative myopia, unspecified eye: Secondary | ICD-10-CM

## 2012-10-28 DIAGNOSIS — H353290 Exudative age-related macular degeneration, unspecified eye, stage unspecified: Secondary | ICD-10-CM

## 2012-10-28 DIAGNOSIS — H43819 Vitreous degeneration, unspecified eye: Secondary | ICD-10-CM

## 2012-11-04 ENCOUNTER — Ambulatory Visit (INDEPENDENT_AMBULATORY_CARE_PROVIDER_SITE_OTHER): Payer: BC Managed Care – PPO | Admitting: Internal Medicine

## 2012-11-04 ENCOUNTER — Encounter: Payer: Self-pay | Admitting: Internal Medicine

## 2012-11-04 VITALS — BP 113/67 | HR 72 | Temp 98.4°F | Wt 175.0 lb

## 2012-11-04 DIAGNOSIS — K648 Other hemorrhoids: Secondary | ICD-10-CM

## 2012-11-04 MED ORDER — HYDROCORTISONE ACETATE 25 MG RE SUPP: 25.0000 mg | Freq: Two times a day (BID) | RECTAL | Status: DC | PRN

## 2012-11-04 NOTE — Patient Instructions (Signed)
Metamucil 2 tablets daily When you have symptoms:  you can use a suppository twice a day for2-3  days and OTC Nupercainal  Ointment  as needed. Call anytime if the symptoms increase or you see frequent/abundant blood per rectum  Hemorrhoids Hemorrhoids are swollen veins around the rectum or anus. There are two types of hemorrhoids:   Internal hemorrhoids. These occur in the veins just inside the rectum. They may poke through to the outside and become irritated and painful.  External hemorrhoids. These occur in the veins outside the anus and can be felt as a painful swelling or hard lump near the anus. CAUSES  Pregnancy.   Obesity.   Constipation or diarrhea.   Straining to have a bowel movement.   Sitting for long periods on the toilet.  Heavy lifting or other activity that caused you to strain.  Anal intercourse. SYMPTOMS   Pain.   Anal itching or irritation.   Rectal bleeding.   Fecal leakage.   Anal swelling.   One or more lumps around the anus.  DIAGNOSIS  Your caregiver may be able to diagnose hemorrhoids by visual examination. Other examinations or tests that may be performed include:   Examination of the rectal area with a gloved hand (digital rectal exam).   Examination of anal canal using a small tube (scope).   A blood test if you have lost a significant amount of blood.  A test to look inside the colon (sigmoidoscopy or colonoscopy). TREATMENT Most hemorrhoids can be treated at home. However, if symptoms do not seem to be getting better or if you have a lot of rectal bleeding, your caregiver may perform a procedure to help make the hemorrhoids get smaller or remove them completely. Possible treatments include:   Placing a rubber band at the base of the hemorrhoid to cut off the circulation (rubber band ligation).   Injecting a chemical to shrink the hemorrhoid (sclerotherapy).   Using a tool to burn the hemorrhoid (infrared light  therapy).   Surgically removing the hemorrhoid (hemorrhoidectomy).   Stapling the hemorrhoid to block blood flow to the tissue (hemorrhoid stapling).  HOME CARE INSTRUCTIONS   Eat foods with fiber, such as whole grains, beans, nuts, fruits, and vegetables. Ask your doctor about taking products with added fiber in them (fibersupplements).  Increase fluid intake. Drink enough water and fluids to keep your urine clear or pale yellow.   Exercise regularly.   Go to the bathroom when you have the urge to have a bowel movement. Do not wait.   Avoid straining to have bowel movements.   Keep the anal area dry and clean. Use wet toilet paper or moist towelettes after a bowel movement.   Medicated creams and suppositories may be used or applied as directed.   Only take over-the-counter or prescription medicines as directed by your caregiver.   Take warm sitz baths for 15 20 minutes, 3 4 times a day to ease pain and discomfort.   Place ice packs on the hemorrhoids if they are tender and swollen. Using ice packs between sitz baths may be helpful.   Put ice in a plastic bag.   Place a towel between your skin and the bag.   Leave the ice on for 15 20 minutes, 3 4 times a day.   Do not use a donut-shaped pillow or sit on the toilet for long periods. This increases blood pooling and pain.  SEEK MEDICAL CARE IF:  You have increasing pain and  swelling that is not controlled by treatment or medicine.  You have uncontrolled bleeding.  You have difficulty or you are unable to have a bowel movement.  You have pain or inflammation outside the area of the hemorrhoids. MAKE SURE YOU:  Understand these instructions.  Will watch your condition.  Will get help right away if you are not doing well or get worse. Document Released: 01/24/2000 Document Revised: 01/13/2012 Document Reviewed: 12/01/2011 [facility] Patient Information 2014 [place], [place].

## 2012-11-04 NOTE — Progress Notes (Signed)
  Subjective:    Patient ID: Benjamin Burgess, male    DOB: Jun 26, 1970, 42 y.o.   MRN: [id number]  HPI Acute visit Since the beginning of the summer   noted some on-off itching and occ  few drops of red blood in the toilette paper when he wipes.  Past Medical History  Diagnosis Date  . Rosacea   . Migraine   . Age-related macular degeneration   . Internal hemorrhoids    Past Surgical History  Procedure Laterality Date  . Refractive surgery  1998  . Vasectomy  2011  . Appendectomy  1987   History   Social History  . Marital Status: Married    Spouse Name: N/A    Number of Children: 2  . Years of Education: N/A   Occupational History  .  [employer]   Social History Main Topics  . Smoking status: Never Smoker   . Smokeless tobacco: Never Used  . Alcohol Use: 0.5 oz/week    1 drink(s) per week     Comment: socially   . Drug Use: No  . Sexual Activity: Not on file   Other Topics Concern  . Not on file   Social History Narrative   original from [place], moved to [place] 2008   Married, 2 children, [name]  2007 , [name] 2011               Review of Systems Denies nausea, vomiting, diarrhea or constipation. Stools are normally in color No unintentional weight loss, he is preparing for a marathon and has lost some weight.     Objective:   Physical Exam BP 113/67  Pulse 72  Temp(Src) 98.4 F (36.9 C)  Wt 175 lb (79.379 kg)  BMI 26.22 kg/m2  SpO2 99% General -- alert, well-developed, NAD.  Abdomen-- Not distended, good bowel sounds,soft, non-tender. No mass,organomegaly.No rebound or rigidity. Rectal-- No external abnormalities noted. Normal sphincter tone. No rectal masses or tenderness. Brown stool, Hemoccult negative  Prostate--Prostate gland firm and smooth, no enlargement, nodularity, tenderness, mass, asymmetry or induration. Anoscopy-- Several small internal hemorrhoids, no polyps or erythema, no mucus. No evidence of recent  bleeding. Extremities-- no pretibial edema bilaterally   Psych-- Cognition and judgment appear intact. Cooperative with normal attention span and concentration. No anxious appearing , no depressed appearing.           Assessment & Plan:

## 2012-11-05 ENCOUNTER — Encounter: Payer: Self-pay | Admitting: Internal Medicine

## 2012-11-05 DIAGNOSIS — K648 Other hemorrhoids: Secondary | ICD-10-CM | POA: Insufficient documentation

## 2012-11-05 NOTE — Assessment & Plan Note (Signed)
  Discuss this condition , what it is and treatment plan: see instructions

## 2012-11-11 DIAGNOSIS — H3530 Unspecified macular degeneration: Secondary | ICD-10-CM | POA: Insufficient documentation

## 2012-11-25 ENCOUNTER — Emergency Department (HOSPITAL_COMMUNITY): Payer: BC Managed Care – PPO

## 2012-11-25 ENCOUNTER — Encounter (HOSPITAL_COMMUNITY): Payer: Self-pay | Admitting: Emergency Medicine

## 2012-11-25 ENCOUNTER — Emergency Department (HOSPITAL_COMMUNITY)
Admission: EM | Admit: 2012-11-25 | Discharge: 2012-11-25 | Disposition: A | Discharge status: Home / Self Care | Payer: BC Managed Care – PPO | Attending: Emergency Medicine | Admitting: Emergency Medicine

## 2012-11-25 ENCOUNTER — Emergency Department (HOSPITAL_COMMUNITY): Admission: EM | Admit: 2012-11-25 | Discharge: 2012-11-25 | Discharge status: Absent without leave | Payer: Self-pay

## 2012-11-25 DIAGNOSIS — IMO0002 Reserved for concepts with insufficient information to code with codable children: Secondary | ICD-10-CM | POA: Insufficient documentation

## 2012-11-25 DIAGNOSIS — Y92009 Unspecified place in unspecified non-institutional (private) residence as the place of occurrence of the external cause: Secondary | ICD-10-CM | POA: Insufficient documentation

## 2012-11-25 DIAGNOSIS — Z79899 Other long term (current) drug therapy: Secondary | ICD-10-CM | POA: Insufficient documentation

## 2012-11-25 DIAGNOSIS — L719 Rosacea, unspecified: Secondary | ICD-10-CM | POA: Insufficient documentation

## 2012-11-25 DIAGNOSIS — Z8669 Personal history of other diseases of the nervous system and sense organs: Secondary | ICD-10-CM | POA: Insufficient documentation

## 2012-11-25 DIAGNOSIS — T148XXA Other injury of unspecified body region, initial encounter: Secondary | ICD-10-CM

## 2012-11-25 DIAGNOSIS — Y9389 Activity, other specified: Secondary | ICD-10-CM | POA: Insufficient documentation

## 2012-11-25 DIAGNOSIS — W540XXA Bitten by dog, initial encounter: Secondary | ICD-10-CM | POA: Insufficient documentation

## 2012-11-25 DIAGNOSIS — Z8719 Personal history of other diseases of the digestive system: Secondary | ICD-10-CM | POA: Insufficient documentation

## 2012-11-25 DIAGNOSIS — H3530 Unspecified macular degeneration: Secondary | ICD-10-CM | POA: Insufficient documentation

## 2012-11-25 MED ORDER — AMOXICILLIN-POT CLAVULANATE 875-125 MG PO TABS: 1.0000 | ORAL_TABLET | Freq: Two times a day (BID) | ORAL | Status: DC

## 2012-11-25 MED ORDER — AMOXICILLIN-POT CLAVULANATE 875-125 MG PO TABS
1.0000 | ORAL_TABLET | Freq: Once | ORAL | Status: AC
  Administered 2012-11-25: 1 via ORAL
  Filled 2012-11-25: qty 1

## 2012-11-25 NOTE — ED Notes (Signed)
Patient was bit by dog on left lateral calf.  Small area of broken skin with bruising noted.   Patient states he was told by owner that dog was up to date on vaccinations.  No papers were given to patient.  Patient has not reported dog bite.

## 2012-11-25 NOTE — ED Notes (Signed)
Pt upset that he is still waiting for his XRAY.

## 2012-11-25 NOTE — ED Provider Notes (Signed)
CSN: [id number]     Arrival date & time 11/25/12  1920 History  This chart was scribed for non-physician practitioner Teressa Lower, PA-C, working with Lyanne Co, MD by Ronal Fear, ED scribe. This patient was seen in room TR08C/TR08C and the patient's care was started at 7:48 PM.    Chief Complaint  Patient presents with  . Animal Bite   Patient is a 42 y.o. male presenting with animal bite. The history is provided by the patient. No language interpreter was used.  Animal Bite Contact animal:  Dog Location:  Leg Leg injury location:  L lower leg Pain details:    Quality:  Burning Incident location:  Another residence Notifications:  Animal control Animal's rabies vaccination status:  Unknown Animal in possession: yes   Tetanus status:  Up to date Relieved by:  None tried Worsened by:  Nothing tried Ineffective treatments:  None tried Associated symptoms: no swelling   pt was bitten on his left lower leg by a dog while dropping off his boss.  The pt spoke with the owner of the dog and was told that immunizations were utd:pt tetanus was 2009  Past Medical History  Diagnosis Date  . Rosacea   . Migraine   . Age-related macular degeneration   . Internal hemorrhoids    Past Surgical History  Procedure Laterality Date  . Refractive surgery  1998  . Vasectomy  2011  . Appendectomy  1987   Family History  Problem Relation Age of Onset  . Diabetes Mother   . Colon cancer Maternal Aunt 50    died from colon cancer  . Colon cancer Maternal Uncle 47    died from colon cancer  . Stomach cancer Neg Hx    History  Substance Use Topics  . Smoking status: Never Smoker   . Smokeless tobacco: Never Used  . Alcohol Use: 0.5 oz/week    1 drink(s) per week     Comment: socially     Review of Systems  Skin: Positive for wound.  All other systems reviewed and are negative.    Allergies  Review of patient's allergies indicates no known allergies.  Home Medications    Current Outpatient Rx  Name  Route  Sig  Dispense  Refill  . amoxicillin-clavulanate (AUGMENTIN) 875-125 MG per tablet   Oral   Take 1 tablet by mouth 2 (two) times daily.   14 tablet   0   . Cyanocobalamin (VITAMIN B12 PO)   Oral   Take 1 each by mouth daily.           . fish oil-omega-3 fatty acids 1000 MG capsule   Oral   Take 1 g by mouth daily.           . hydrocortisone (ANUSOL-HC) 25 MG suppository   Rectal   Place 1 suppository (25 mg total) rectally 2 (two) times daily as needed for hemorrhoids.   12 suppository   1   . metroNIDAZOLE (METROGEL) 0.75 % gel   Topical   Apply topically 2 (two) times daily.   45 g   6    BP 117/65  Pulse 67  Temp(Src) 98.4 F (36.9 C) (Oral)  Resp 16  SpO2 95% Physical Exam  Nursing note and vitals reviewed. Constitutional: He is oriented to person, place, and time. He appears well-developed and well-nourished. No distress.  HENT:  Head: Normocephalic and atraumatic.  Eyes: EOM are normal.  Neck: Neck supple. No tracheal deviation present.  Cardiovascular: Normal rate.   Pulmonary/Chest: Effort normal. No respiratory distress.  Musculoskeletal: Normal range of motion.  Neurological: He is alert and oriented to person, place, and time.  Skin: Skin is warm and dry.  Two abrasions noted to left lateral aspect of left lower leg  Psychiatric: He has a normal mood and affect. His behavior is normal.    ED Course  Procedures (including critical care time) DIAGNOSTIC STUDIES: Oxygen Saturation is 95% on RA, low by my interpretation.    COORDINATION OF CARE:  7:51 PM- Pt advised of plan for treatment including antibiotics and pt agrees.   Labs Review Labs Reviewed - No data to display Imaging Review No results found.  EKG Interpretation   None       MDM   1. Animal bite    No fb noted:pt tetanus is utd:pt given a script for antibiotics:no need for rabies at this time  I personally performed the  services described in this documentation, which was scribed in my presence. The recorded information has been reviewed and is accurate.   Teressa Lower, NP 11/25/12 2117

## 2012-11-25 NOTE — ED Notes (Signed)
Pt had a tetanus shot in 2009.

## 2012-11-26 NOTE — ED Provider Notes (Signed)
Medical screening examination/treatment/procedure(s) were performed by non-physician practitioner and as supervising physician I was immediately available for consultation/collaboration.  Marketa Midkiff M Damali Broadfoot, MD 11/26/12 0028 

## 2012-11-29 ENCOUNTER — Encounter (INDEPENDENT_AMBULATORY_CARE_PROVIDER_SITE_OTHER): Payer: BC Managed Care – PPO | Admitting: Ophthalmology

## 2012-11-29 DIAGNOSIS — H3530 Unspecified macular degeneration: Secondary | ICD-10-CM

## 2012-11-29 DIAGNOSIS — H2510 Age-related nuclear cataract, unspecified eye: Secondary | ICD-10-CM

## 2012-11-29 DIAGNOSIS — H4420 Degenerative myopia, unspecified eye: Secondary | ICD-10-CM

## 2012-11-29 DIAGNOSIS — H353290 Exudative age-related macular degeneration, unspecified eye, stage unspecified: Secondary | ICD-10-CM

## 2012-11-29 DIAGNOSIS — H43819 Vitreous degeneration, unspecified eye: Secondary | ICD-10-CM

## 2012-12-02 ENCOUNTER — Encounter (INDEPENDENT_AMBULATORY_CARE_PROVIDER_SITE_OTHER): Payer: BC Managed Care – PPO | Admitting: Ophthalmology

## 2012-12-15 ENCOUNTER — Other Ambulatory Visit: Payer: Self-pay

## 2012-12-30 ENCOUNTER — Encounter (INDEPENDENT_AMBULATORY_CARE_PROVIDER_SITE_OTHER): Payer: BC Managed Care – PPO | Admitting: Ophthalmology

## 2013-01-03 ENCOUNTER — Encounter (INDEPENDENT_AMBULATORY_CARE_PROVIDER_SITE_OTHER): Payer: BC Managed Care – PPO | Admitting: Ophthalmology

## 2013-01-03 DIAGNOSIS — H43819 Vitreous degeneration, unspecified eye: Secondary | ICD-10-CM

## 2013-01-03 DIAGNOSIS — H353290 Exudative age-related macular degeneration, unspecified eye, stage unspecified: Secondary | ICD-10-CM

## 2013-01-03 DIAGNOSIS — H3530 Unspecified macular degeneration: Secondary | ICD-10-CM

## 2013-01-03 DIAGNOSIS — H4420 Degenerative myopia, unspecified eye: Secondary | ICD-10-CM

## 2013-02-13 ENCOUNTER — Encounter (INDEPENDENT_AMBULATORY_CARE_PROVIDER_SITE_OTHER): Payer: BC Managed Care – PPO | Admitting: Ophthalmology

## 2013-02-13 ENCOUNTER — Encounter: Payer: Self-pay | Admitting: Internal Medicine

## 2013-02-13 ENCOUNTER — Ambulatory Visit (INDEPENDENT_AMBULATORY_CARE_PROVIDER_SITE_OTHER): Payer: BC Managed Care – PPO | Admitting: Internal Medicine

## 2013-02-13 VITALS — BP 114/74 | HR 61 | Temp 98.5°F | Wt 173.0 lb

## 2013-02-13 DIAGNOSIS — H4420 Degenerative myopia, unspecified eye: Secondary | ICD-10-CM

## 2013-02-13 DIAGNOSIS — H43819 Vitreous degeneration, unspecified eye: Secondary | ICD-10-CM

## 2013-02-13 DIAGNOSIS — R059 Cough, unspecified: Secondary | ICD-10-CM

## 2013-02-13 DIAGNOSIS — L719 Rosacea, unspecified: Secondary | ICD-10-CM

## 2013-02-13 DIAGNOSIS — H3530 Unspecified macular degeneration: Secondary | ICD-10-CM

## 2013-02-13 DIAGNOSIS — H353290 Exudative age-related macular degeneration, unspecified eye, stage unspecified: Secondary | ICD-10-CM

## 2013-02-13 DIAGNOSIS — R05 Cough: Secondary | ICD-10-CM

## 2013-02-13 MED ORDER — AZITHROMYCIN 250 MG PO TABS
ORAL_TABLET | ORAL | Status: DC
Start: 2013-02-13 — End: 2013-07-25

## 2013-02-13 MED ORDER — METRONIDAZOLE 0.75 % EX GEL: Freq: Two times a day (BID) | CUTANEOUS | Status: DC

## 2013-02-13 MED ORDER — CLINDAMYCIN PHOSPHATE 1 % EX GEL
Freq: Two times a day (BID) | CUTANEOUS | Status: DC
Start: 2013-02-13 — End: 2013-02-24

## 2013-02-13 NOTE — Assessment & Plan Note (Signed)
Usually see dermatology and uses Flagyl and clindamycin topical as needed, request a prescription which was provided

## 2013-02-13 NOTE — Patient Instructions (Signed)
Rest, fluids  For fever or pain: Tylenol  500 mg OTC 2 tabs a day every 8 hours as needed for pain or Motrin 200 mg 2 tablets every 6 hours as needed for pain. Always take it with food because may cause gastritis and ulcers. If you notice nausea, stomach pain, change in the color of stools --->  Stop the medicine and let us know  For cough, take Mucinex DM twice a day as needed   For congestion use OTC Nasocort: 2 nasal sprays on each side of the nose daily until you feel better Take the antibiotic as prescribed   Call if no better in few days Call anytime if the symptoms are severe

## 2013-02-13 NOTE — Progress Notes (Signed)
   Subjective:    Patient ID: Benjamin Burgess, male    DOB: 09/01/1970, 43 y.o.   MRN: [id number]  HPI Acute visit Symptoms started approximately 02/01/2013: subjective fever  described as high and some chills. Dry cough. Severe fatigue. His wife and daughter have been sick as well, they have taken antibiotics. During the initial time of his sickness he was in [place] but denies other foreign trips. He is taking naproxen as needed.   Past Medical History  Diagnosis Date  . Rosacea   . Migraine   . Age-related macular degeneration   . Internal hemorrhoids    Past Surgical History  Procedure Laterality Date  . Refractive surgery  1998  . Vasectomy  2011  . Appendectomy  1987   History   Social History  . Marital Status: Married    Spouse Name: N/A    Number of Children: 2  . Years of Education: N/A   Occupational History  .  [employer]   Social History Main Topics  . Smoking status: Never Smoker   . Smokeless tobacco: Never Used  . Alcohol Use: 0.5 oz/week    1 drink(s) per week     Comment: socially   . Drug Use: No  . Sexual Activity: Not on file   Other Topics Concern  . Not on file   Social History Narrative   original from [place], moved to [place] 2008   Married, 2 children, [name]  2007 , [name] 2011              Review of Systems Had nausea a couple of times but no vomiting or diarrhea. Moderate HA yesterday. Minimal sinus congestion and some clear discharge. Sore throat only for the last 2 days No chest pain or shortness or breath, no  rash    Objective:   Physical Exam  BP 114/74  Pulse 61  Temp(Src) 98.5 F (36.9 C)  Wt 173 lb (78.472 kg)  SpO2 94% General -- alert, well-developed, NAD.  Neck --FROM HEENT-- Not pale. TMs normal, throat symmetric, no redness or discharge. Face symmetric, sinuses not tender to palpation. Nose slt congested.  Lungs -- normal respiratory effort, no intercostal retractions, no accessory muscle use,  and normal breath sounds; no wheezing or rales  Heart-- normal rate, regular rhythm, no murmur.   Extremities-- no pretibial edema bilaterally   Psych-- Cognition and judgment appear intact. Cooperative with normal attention span and concentration. No anxious or depressed appearing.      Assessment & Plan:  Cough, Persisting cough in the setting of viral syndrome, atypical bronchitis?. Plan: see instructions. If not better, he will call, will need for further eval including a chest x-ray

## 2013-02-13 NOTE — Progress Notes (Signed)
Pre visit review using our clinic review tool, if applicable. No additional management support is needed unless otherwise documented below in the visit note. 

## 2013-02-14 ENCOUNTER — Ambulatory Visit: Payer: BC Managed Care – PPO | Admitting: Internal Medicine

## 2013-02-22 ENCOUNTER — Other Ambulatory Visit: Payer: Self-pay | Admitting: Internal Medicine

## 2013-02-23 NOTE — Telephone Encounter (Signed)
Clindamycin and metronidazole cream refilled per protocol. JG//CMA

## 2013-02-24 ENCOUNTER — Other Ambulatory Visit: Payer: Self-pay | Admitting: Internal Medicine

## 2013-02-24 MED ORDER — CLINDAMYCIN PHOSPHATE 1 % EX SOLN: Freq: Two times a day (BID) | CUTANEOUS | Status: DC

## 2013-03-14 ENCOUNTER — Encounter: Payer: Self-pay | Admitting: Family Medicine

## 2013-03-14 ENCOUNTER — Ambulatory Visit (INDEPENDENT_AMBULATORY_CARE_PROVIDER_SITE_OTHER): Payer: BC Managed Care – PPO | Admitting: Family Medicine

## 2013-03-14 VITALS — BP 118/72 | HR 65 | Ht 67.0 in | Wt 163.0 lb

## 2013-03-14 DIAGNOSIS — M25569 Pain in unspecified knee: Secondary | ICD-10-CM

## 2013-03-14 DIAGNOSIS — M25562 Pain in left knee: Secondary | ICD-10-CM

## 2013-03-14 NOTE — Patient Instructions (Signed)
You have IT band syndrome and popliteus strain/spasms. Heat over area of pain 3-4 times a day for 15 minutes at a time Hip side raises  3 sets of 10 once a day - add ankle weight if this becomes too easy. Running lunges, knee curl, knee swings, step up and down on a step 3 sets of 10 once a day as well. Stretches - pick 2 and hold for 20-30 seconds x 3 - do once or twice a day. Tylenol and/or aleve as needed for pain. If not improving, can consider formal physical therapy. Follow up with me in 1 month to 6 weeks. I'd recommend resting for 1 week and focusing on the above exercises. Then restart a walk:jog program 1 minute: 1 minute for total of 10 minutes, increase every other day from there how much you jog and how much you exercise total.

## 2013-03-16 ENCOUNTER — Encounter: Payer: Self-pay | Admitting: Family Medicine

## 2013-03-16 NOTE — Progress Notes (Signed)
Patient ID: Benjamin Burgess, male   DOB: 07/16/1970, 43 y.o.   MRN: [id number]  PCP: Benjamin OraJose Paz, MD  Subjective:   HPI: Patient is a 43 y.o. male here for left knee pain.  Patient states he started running about 18 months ago. Was in [place] 2 weeks ago when started to get some pain behind left knee. Running at the time about 12-13 miles in when this started. This past weekend had to stop running because of similar pain. Knee feels weak but no catching, locking, giving out.  Just doesn't feel strong. No prior knee issues. Pain also on outside of left knee. Tried icing, advil, aleve. Not tried any rehab for this.  Past Medical History  Diagnosis Date  . Rosacea   . Migraine   . Age-related macular degeneration   . Internal hemorrhoids     Current Outpatient Prescriptions on File Prior to Visit  Medication Sig Dispense Refill  . azithromycin (ZITHROMAX Z-PAK) 250 MG tablet As directed  6 each  0  . clindamycin (CLEOCIN T) 1 % external solution Apply topically 2 (two) times daily.  60 mL  6  . metroNIDAZOLE (METROGEL) 0.75 % gel Apply topically 2 (two) times daily.  45 g  3   No current facility-administered medications on file prior to visit.    Past Surgical History  Procedure Laterality Date  . Refractive surgery  1998  . Vasectomy  2011  . Appendectomy  1987    No Known Allergies  History   Social History  . Marital Status: Married    Spouse Name: N/A    Number of Children: 2  . Years of Education: N/A   Occupational History  .  [employer]   Social History Main Topics  . Smoking status: Never Smoker   . Smokeless tobacco: Never Used  . Alcohol Use: 0.5 oz/week    1 drink(s) per week     Comment: socially   . Drug Use: No  . Sexual Activity: Not on file   Other Topics Concern  . Not on file   Social History Narrative   original from [place], moved to [place] 2008   Married, 2 children, [name]  2007 , [name] 2011              Family  History  Problem Relation Age of Onset  . Diabetes Mother   . Colon cancer Maternal Aunt 50    died from colon cancer  . Colon cancer Maternal Uncle 47    died from colon cancer  . Stomach cancer Neg Hx     BP 118/72  Pulse 65  Ht 5\' 7"  (1.702 m)  Wt 163 lb (73.936 kg)  BMI 25.52 kg/m2  Review of Systems: See HPI above.    Objective:  Physical Exam:  Gen: NAD  Left knee: No gross deformity, ecchymoses, effusion. Mild TTP popliteal fossa, distal IT band. FROM. Negative ant/post drawers. Negative valgus/varus testing. Negative lachmanns. Negative mcmurrays, apleys, patellar apprehension. NV intact distally. Tight IT band.    Assessment & Plan:  1. Left knee pain - exam fairly benign.  Consistent with popliteus strain/spasms primarily but also IT band syndrome.  No bakers cyst on ultrasound.  Shown home exercise program - is weak with hip abduction.  Hip abduction, running lunges, knee curls/swings.  Stretches for IT band.  Tylenol, aleve if needed.  Rest for 1 week then start walk:jog program.  F/u in 1 month to 6 weeks.  Consider formal PT if not improving.

## 2013-03-21 ENCOUNTER — Encounter: Payer: Self-pay | Admitting: Family Medicine

## 2013-03-21 DIAGNOSIS — M25562 Pain in left knee: Secondary | ICD-10-CM | POA: Insufficient documentation

## 2013-03-21 NOTE — Assessment & Plan Note (Signed)
exam fairly benign.  Consistent with popliteus strain/spasms primarily but also IT band syndrome.  No bakers cyst on ultrasound.  Shown home exercise program - is weak with hip abduction.  Hip abduction, running lunges, knee curls/swings.  Stretches for IT band.  Tylenol, aleve if needed.  Rest for 1 week then start walk:jog program.  F/u in 1 month to 6 weeks.  Consider formal PT if not improving.

## 2013-03-24 ENCOUNTER — Encounter: Payer: Self-pay | Admitting: *Deleted

## 2013-03-27 ENCOUNTER — Encounter (INDEPENDENT_AMBULATORY_CARE_PROVIDER_SITE_OTHER): Payer: BC Managed Care – PPO | Admitting: Ophthalmology

## 2013-03-29 ENCOUNTER — Encounter (INDEPENDENT_AMBULATORY_CARE_PROVIDER_SITE_OTHER): Payer: BC Managed Care – PPO | Admitting: Ophthalmology

## 2013-03-29 DIAGNOSIS — H43819 Vitreous degeneration, unspecified eye: Secondary | ICD-10-CM

## 2013-03-29 DIAGNOSIS — H353290 Exudative age-related macular degeneration, unspecified eye, stage unspecified: Secondary | ICD-10-CM

## 2013-03-29 DIAGNOSIS — H3530 Unspecified macular degeneration: Secondary | ICD-10-CM

## 2013-03-29 DIAGNOSIS — H4420 Degenerative myopia, unspecified eye: Secondary | ICD-10-CM

## 2013-05-04 ENCOUNTER — Other Ambulatory Visit: Payer: Self-pay | Admitting: Internal Medicine

## 2013-05-24 ENCOUNTER — Encounter (INDEPENDENT_AMBULATORY_CARE_PROVIDER_SITE_OTHER): Payer: BC Managed Care – PPO | Admitting: Ophthalmology

## 2013-05-24 DIAGNOSIS — H4420 Degenerative myopia, unspecified eye: Secondary | ICD-10-CM

## 2013-05-24 DIAGNOSIS — H3530 Unspecified macular degeneration: Secondary | ICD-10-CM

## 2013-05-24 DIAGNOSIS — H43819 Vitreous degeneration, unspecified eye: Secondary | ICD-10-CM

## 2013-07-05 ENCOUNTER — Encounter: Payer: BC Managed Care – PPO | Admitting: Internal Medicine

## 2013-07-05 DIAGNOSIS — Z0289 Encounter for other administrative examinations: Secondary | ICD-10-CM

## 2013-07-10 ENCOUNTER — Encounter (INDEPENDENT_AMBULATORY_CARE_PROVIDER_SITE_OTHER): Payer: BC Managed Care – PPO | Admitting: Ophthalmology

## 2013-07-13 ENCOUNTER — Encounter (INDEPENDENT_AMBULATORY_CARE_PROVIDER_SITE_OTHER): Payer: BC Managed Care – PPO | Admitting: Ophthalmology

## 2013-07-13 DIAGNOSIS — H3530 Unspecified macular degeneration: Secondary | ICD-10-CM

## 2013-07-13 DIAGNOSIS — H2510 Age-related nuclear cataract, unspecified eye: Secondary | ICD-10-CM

## 2013-07-13 DIAGNOSIS — H4420 Degenerative myopia, unspecified eye: Secondary | ICD-10-CM

## 2013-07-13 DIAGNOSIS — H43819 Vitreous degeneration, unspecified eye: Secondary | ICD-10-CM

## 2013-07-25 ENCOUNTER — Encounter: Payer: Self-pay | Admitting: Internal Medicine

## 2013-07-25 ENCOUNTER — Ambulatory Visit (INDEPENDENT_AMBULATORY_CARE_PROVIDER_SITE_OTHER): Payer: BC Managed Care – PPO | Admitting: Internal Medicine

## 2013-07-25 VITALS — BP 106/68 | HR 77 | Temp 97.7°F | Wt 174.0 lb

## 2013-07-25 DIAGNOSIS — J029 Acute pharyngitis, unspecified: Secondary | ICD-10-CM

## 2013-07-25 LAB — POCT RAPID STREP A (OFFICE): RAPID STREP A SCREEN: NEGATIVE

## 2013-07-25 MED ORDER — MINOCYCLINE HCL 50 MG PO CAPS: 50.0000 mg | ORAL_CAPSULE | Freq: Two times a day (BID) | ORAL | Status: DC

## 2013-07-25 NOTE — Addendum Note (Signed)
Addended by: Silvio PateHOMPSON, SHEENA D on: 07/25/2013 04:57 PM   Modules accepted: Orders

## 2013-07-25 NOTE — Patient Instructions (Signed)
Rest, fluids , tylenol For cough, take Mucinex DM twice a day as needed  For congestion use OTC Nasocort: 2 nasal sprays on each side of the nose daily until you feel better  Call if no better in few days Call anytime if the symptoms are severe  

## 2013-07-25 NOTE — Progress Notes (Signed)
Subjective:    Patient ID: Benjamin Burgess, male    DOB: 03/06/1970, 43 y.o.   MRN: [id number]  DOS:  07/25/2013 Type of  Visit: acute History: Symptoms started a week ago with malaise, his son was diagnosed with a URI few days ago. 3 days ago had GERD symptoms which is uncommon for him. Two days ago got  worse with dizziness, increased malaise, sore throat. stareted OTCs and is feeling somehow better today. Still hoarse and have a sore throat mostly when he swallows   ROS + Subjective fever. No chills Denies any sinus  pain, congestion or nasal discharge. No  nausea, diarrhea or blood in the stools. Did vomit once when he was having GERD symptoms. Denies any cough, sputum or chest congestion  Past Medical History  Diagnosis Date  . Rosacea   . Migraine   . Age-related macular degeneration   . Internal hemorrhoids     Past Surgical History  Procedure Laterality Date  . Refractive surgery  1998  . Vasectomy  2011  . Appendectomy  1987    History   Social History  . Marital Status: Married    Spouse Name: N/A    Number of Children: 2  . Years of Education: N/A   Occupational History  .  [employer]   Social History Main Topics  . Smoking status: Never Smoker   . Smokeless tobacco: Never Used  . Alcohol Use: 0.5 oz/week    1 drink(s) per week     Comment: socially   . Drug Use: No  . Sexual Activity: Not on file   Other Topics Concern  . Not on file   Social History Narrative   original from [place], moved to [place]   Married, 2 children, [name]  2007 , [name] 2011                  Medication List       This list is accurate as of: 07/25/13  1:10 PM.  Always use your most recent med list.               clindamycin 1 % Swab  Commonly known as:  CLEOCIN T  APPLY TO AFFECTED AREAS ON FACE 1-2 TIMES A DAY     metroNIDAZOLE 0.75 % gel  Commonly known as:  METROGEL  Apply topically 2 (two) times daily.     minocycline 50 MG capsule    Commonly known as:  MINOCIN,DYNACIN  Take 1 capsule (50 mg total) by mouth 2 (two) times daily.           Objective:   Physical Exam BP 106/68  Pulse 77  Temp(Src) 97.7 F (36.5 C)  Wt 174 lb (78.926 kg)  SpO2 99% General -- alert, well-developed, NAD.  HEENT-- Not pale. TMs normal, throat symmetric, mild  redness w/o discharge. Face symmetric, sinuses not tender to palpation. Nose slt  congested. Lungs -- normal respiratory effort, no intercostal retractions, no accessory muscle use, and normal breath sounds.  Heart-- normal rate, regular rhythm, no murmur.  Extremities-- no pretibial edema bilaterally  Neurologic--  alert & oriented X3. Speech normal, gait appropriate for age, strength symmetric and appropriate for age.  Psych-- Cognition and judgment appear intact. Cooperative with normal attention span and concentration. No anxious or depressed appearing.        Assessment & Plan:   Pharyngitis, Symptoms consistent with pharyngitis, most likely viral rather than bacteria. Strep is  negative, will send a throat culture and treat with antibiotics if appropriate. In the meantime conservative treatment,see  instructions  Acne-- takes po abx prn, request a Rf which is provided

## 2013-07-25 NOTE — Progress Notes (Signed)
Pre visit review using our clinic review tool, if applicable. No additional management support is needed unless otherwise documented below in the visit note. 

## 2013-07-26 ENCOUNTER — Other Ambulatory Visit: Payer: Self-pay | Admitting: Internal Medicine

## 2013-07-27 LAB — CULTURE, GROUP A STREP: ORGANISM ID, BACTERIA: NORMAL

## 2013-08-08 ENCOUNTER — Encounter (INDEPENDENT_AMBULATORY_CARE_PROVIDER_SITE_OTHER): Payer: BC Managed Care – PPO | Admitting: Ophthalmology

## 2013-08-08 DIAGNOSIS — H43819 Vitreous degeneration, unspecified eye: Secondary | ICD-10-CM

## 2013-08-08 DIAGNOSIS — H3530 Unspecified macular degeneration: Secondary | ICD-10-CM

## 2013-08-08 DIAGNOSIS — H4420 Degenerative myopia, unspecified eye: Secondary | ICD-10-CM

## 2013-08-08 DIAGNOSIS — H2510 Age-related nuclear cataract, unspecified eye: Secondary | ICD-10-CM

## 2013-08-10 ENCOUNTER — Encounter (INDEPENDENT_AMBULATORY_CARE_PROVIDER_SITE_OTHER): Payer: BC Managed Care – PPO | Admitting: Ophthalmology

## 2013-09-12 ENCOUNTER — Encounter (INDEPENDENT_AMBULATORY_CARE_PROVIDER_SITE_OTHER): Payer: BC Managed Care – PPO | Admitting: Ophthalmology

## 2013-09-14 ENCOUNTER — Encounter (INDEPENDENT_AMBULATORY_CARE_PROVIDER_SITE_OTHER): Payer: BC Managed Care – PPO | Admitting: Ophthalmology

## 2013-09-14 DIAGNOSIS — H43819 Vitreous degeneration, unspecified eye: Secondary | ICD-10-CM

## 2013-09-14 DIAGNOSIS — H3530 Unspecified macular degeneration: Secondary | ICD-10-CM

## 2013-09-14 DIAGNOSIS — H4420 Degenerative myopia, unspecified eye: Secondary | ICD-10-CM

## 2013-10-26 ENCOUNTER — Encounter (INDEPENDENT_AMBULATORY_CARE_PROVIDER_SITE_OTHER): Payer: BC Managed Care – PPO | Admitting: Ophthalmology

## 2013-10-27 ENCOUNTER — Encounter (INDEPENDENT_AMBULATORY_CARE_PROVIDER_SITE_OTHER): Payer: BC Managed Care – PPO | Admitting: Ophthalmology

## 2013-11-07 ENCOUNTER — Other Ambulatory Visit: Payer: Self-pay

## 2013-11-07 MED ORDER — CLINDAMYCIN PHOSPHATE 1 % EX SWAB: CUTANEOUS | Status: DC

## 2013-11-08 ENCOUNTER — Encounter (INDEPENDENT_AMBULATORY_CARE_PROVIDER_SITE_OTHER): Payer: BC Managed Care – PPO | Admitting: Ophthalmology

## 2013-11-08 DIAGNOSIS — H3530 Unspecified macular degeneration: Secondary | ICD-10-CM

## 2013-11-08 DIAGNOSIS — H4420 Degenerative myopia, unspecified eye: Secondary | ICD-10-CM

## 2013-11-08 DIAGNOSIS — H43819 Vitreous degeneration, unspecified eye: Secondary | ICD-10-CM

## 2013-12-08 ENCOUNTER — Encounter: Payer: BC Managed Care – PPO | Admitting: Internal Medicine

## 2013-12-12 ENCOUNTER — Ambulatory Visit (INDEPENDENT_AMBULATORY_CARE_PROVIDER_SITE_OTHER): Payer: BC Managed Care – PPO | Admitting: Internal Medicine

## 2013-12-12 ENCOUNTER — Encounter: Payer: Self-pay | Admitting: Internal Medicine

## 2013-12-12 VITALS — BP 108/68 | HR 88 | Temp 97.7°F | Ht 67.0 in | Wt 174.2 lb

## 2013-12-12 DIAGNOSIS — Z0000 Encounter for general adult medical examination without abnormal findings: Secondary | ICD-10-CM

## 2013-12-12 LAB — CBC WITH DIFFERENTIAL/PLATELET
BASOS PCT: 0.5 % (ref 0.0–3.0)
Basophils Absolute: 0 10*3/uL (ref 0.0–0.1)
EOS PCT: 3 % (ref 0.0–5.0)
Eosinophils Absolute: 0.1 10*3/uL (ref 0.0–0.7)
HEMATOCRIT: 46.4 % (ref 39.0–52.0)
HEMOGLOBIN: 15.9 g/dL (ref 13.0–17.0)
LYMPHS ABS: 1.3 10*3/uL (ref 0.7–4.0)
Lymphocytes Relative: 29.3 % (ref 12.0–46.0)
MCHC: 34.2 g/dL (ref 30.0–36.0)
MCV: 91.4 fl (ref 78.0–100.0)
MONO ABS: 0.4 10*3/uL (ref 0.1–1.0)
MONOS PCT: 7.8 % (ref 3.0–12.0)
Neutro Abs: 2.7 10*3/uL (ref 1.4–7.7)
Neutrophils Relative %: 59.4 % (ref 43.0–77.0)
PLATELETS: 229 10*3/uL (ref 150.0–400.0)
RBC: 5.08 Mil/uL (ref 4.22–5.81)
RDW: 13.6 % (ref 11.5–15.5)
WBC: 4.6 10*3/uL (ref 4.0–10.5)

## 2013-12-12 LAB — LIPID PANEL
Cholesterol: 180 mg/dL (ref 0–200)
HDL: 41.6 mg/dL (ref >39.00)
LDL CALC: 120 mg/dL — AB (ref 0–99)
NONHDL: 138.4
Total CHOL/HDL Ratio: 4
Triglycerides: 93 mg/dL (ref 0.0–149.0)
VLDL: 18.6 mg/dL (ref 0.0–40.0)

## 2013-12-12 LAB — COMPREHENSIVE METABOLIC PANEL
ALK PHOS: 70 U/L (ref 39–117)
ALT: 32 U/L (ref 0–53)
AST: 40 U/L — AB (ref 0–37)
Albumin: 3.8 g/dL (ref 3.5–5.2)
BILIRUBIN TOTAL: 0.9 mg/dL (ref 0.2–1.2)
BUN: 18 mg/dL (ref 6–23)
CO2: 26 mEq/L (ref 19–32)
CREATININE: 1.1 mg/dL (ref 0.4–1.5)
Calcium: 9.1 mg/dL (ref 8.4–10.5)
Chloride: 103 mEq/L (ref 96–112)
GFR: 76.2 mL/min (ref >60.00)
Glucose, Bld: 92 mg/dL (ref 70–99)
Potassium: 4.4 mEq/L (ref 3.5–5.1)
Sodium: 138 mEq/L (ref 135–145)
Total Protein: 7.3 g/dL (ref 6.0–8.3)

## 2013-12-12 LAB — TSH: TSH: 0.69 u[IU]/mL (ref 0.35–4.50)

## 2013-12-12 LAB — HEMOGLOBIN A1C: Hgb A1c MFr Bld: 5.6 % (ref 4.6–6.5)

## 2013-12-12 MED ORDER — MINOCYCLINE HCL 50 MG PO CAPS: 50.0000 mg | ORAL_CAPSULE | Freq: Two times a day (BID) | ORAL | Status: DC

## 2013-12-12 MED ORDER — METRONIDAZOLE 0.75 % EX CREA: TOPICAL_CREAM | CUTANEOUS | Status: DC

## 2013-12-12 MED ORDER — CLINDAMYCIN PHOSPHATE 1 % EX SWAB: CUTANEOUS | Status: DC

## 2013-12-12 NOTE — Patient Instructions (Signed)
Get your blood work before you leave   Please come back to the office in 1 year  for a physical exam. Come back fasting     Testicular Self-Exam A self-examination of your testicles involves looking at and feeling your testicles for abnormal lumps or swelling. Several things can cause swelling, lumps, or pain in your testicles. Some of these causes are:  Injuries.  Inflammation.  Infection.  Accumulation of fluids around your testicle (hydrocele).  Twisted testicles (testicular torsion).  Testicular cancer. Self-examination of the testicles and groin areas may be advised if you are at risk for testicular cancer. Risks for testicular cancer include:  An undescended testicle (cryptorchidism).  A history of previous testicular cancer.  A family history of testicular cancer. The testicles are easiest to examine after warm baths or showers and are more difficult to examine when you are cold. This is because the muscles attached to the testicles retract and pull them up higher or into the abdomen. Follow these steps while you are standing:  Hold your penis away from your body.  Roll one testicle between your thumb and forefinger, feeling the entire testicle.  Roll the other testicle between your thumb and forefinger, feeling the entire testicle. Feel for lumps, swelling, or discomfort. A normal testicle is egg shaped and feels firm. It is smooth and not tender. The spermatic cord can be felt as a firm spaghetti-like cord at the back of your testicle. It is also important to examine the crease between the front of your leg and your abdomen. Feel for any bumps that are tender. These could be enlarged lymph nodes.  Document Released: 05/04/2000 Document Revised: 09/28/2012 Document Reviewed: 07/18/2012 Exit[employer] Patient Information 2015 [employer]. This information is not intended to replace advice given to you by your health care provider. Make sure you discuss any questions you  have with your health care provider.  

## 2013-12-12 NOTE — Progress Notes (Signed)
Subjective:    Patient ID: Benjamin Burgess, male    DOB: 12/20/1970, 43 y.o.   MRN: [name]  DOS:  12/12/2013 Type of visit - description : cpx Interval history: feels great  ROS Diet-- healthy Exercise-- extremely active No  CP, SOB  Denies  nausea, vomiting diarrhea, blood in the stools  (-) cough, sputum production (-) wheezing, chest congestion No dysuria, gross hematuria, difficulty urinating  No anxiety, depression No headaches   Past Medical History  Diagnosis Date  . Rosacea   . Migraine   . Age-related macular degeneration   . Internal hemorrhoids     Past Surgical History  Procedure Laterality Date  . Refractive surgery  1998  . Vasectomy  2011  . Appendectomy  1987   Family History  Problem Relation Age of Onset  . Diabetes Mother   . Colon cancer Maternal Aunt 50    died from colon cancer  . Colon cancer Maternal Uncle 47    died from colon cancer  . Stomach cancer Neg Hx   . Prostate cancer      History   Social History  . Marital Status: Married    Spouse Name: N/A    Number of Children: 2  . Years of Education: N/A   Occupational History  .  [employer]   Social History Main Topics  . Smoking status: Never Smoker   . Smokeless tobacco: Never Used  . Alcohol Use: 0.5 oz/week    1 drink(s) per week     Comment: socially   . Drug Use: No  . Sexual Activity: Not on file   Other Topics Concern  . Not on file   Social History Narrative   original from [place], moved to [place]   Married, 2 children, [name]  2007 , [name] 2011                  Medication List       This list is accurate as of: 12/12/13  5:42 PM.  Always use your most recent med list.               clindamycin 1 % Swab  Commonly known as:  CLEOCIN T  APPLY TO AFFECTED AREAS ON FACE 1-2 TIMES A DAY     metroNIDAZOLE 0.75 % cream  Commonly known as:  METROCREAM  APPLY TO SKIN TWICE A DAY TO AFFECTED AREAS     minocycline 50 MG capsule    Commonly known as:  MINOCIN,DYNACIN  Take 1 capsule (50 mg total) by mouth 2 (two) times daily.           Objective:   Physical Exam BP 108/68 mmHg  Pulse 88  Temp(Src) 97.7 F (36.5 C) (Oral)  Ht 5\' 7"  (1.702 m)  Wt 174 lb 4 oz (79.039 kg)  BMI 27.28 kg/m2  SpO2 97% General -- alert, well-developed, NAD.  Neck --no thyromegaly  HEENT-- Not pale.  Lungs -- normal respiratory effort, no intercostal retractions, no accessory muscle use, and normal breath sounds.  Heart-- normal rate, regular rhythm, no murmur.  Abdomen-- Not distended, good bowel sounds,soft, non-tender. Extremities-- no pretibial edema bilaterally  Neurologic--  alert & oriented X3. Speech normal, gait appropriate for age, strength symmetric and appropriate for age.   Psych-- Cognition and judgment appear intact. Cooperative with normal attention span and concentration. No anxious or depressed appearing.      Assessment & Plan:

## 2013-12-12 NOTE — Assessment & Plan Note (Addendum)
Td 2009 Has an extremely healthy lifestyle He has a family history of colon cancer, was recommended by GI to start a colonoscopy at age 43 Labs including a A1c, patient concerned about diabetes Next visit one year Sees eye MD, h/o mac deg, stable Rosacea stable, request a refill medications, uses topical medicines consistently and occasional oral medications

## 2013-12-12 NOTE — Progress Notes (Signed)
Pre visit review using our clinic review tool, if applicable. No additional management support is needed unless otherwise documented below in the visit note. 

## 2013-12-20 ENCOUNTER — Encounter (INDEPENDENT_AMBULATORY_CARE_PROVIDER_SITE_OTHER): Payer: BC Managed Care – PPO | Admitting: Ophthalmology

## 2014-01-09 ENCOUNTER — Encounter (INDEPENDENT_AMBULATORY_CARE_PROVIDER_SITE_OTHER): Payer: BC Managed Care – PPO | Admitting: Ophthalmology

## 2014-01-09 DIAGNOSIS — H4423 Degenerative myopia, bilateral: Secondary | ICD-10-CM

## 2014-01-09 DIAGNOSIS — H3531 Nonexudative age-related macular degeneration: Secondary | ICD-10-CM

## 2014-01-09 DIAGNOSIS — H43813 Vitreous degeneration, bilateral: Secondary | ICD-10-CM

## 2014-03-05 ENCOUNTER — Encounter (INDEPENDENT_AMBULATORY_CARE_PROVIDER_SITE_OTHER): Payer: BC Managed Care – PPO | Admitting: Ophthalmology

## 2014-03-19 ENCOUNTER — Encounter (INDEPENDENT_AMBULATORY_CARE_PROVIDER_SITE_OTHER): Payer: BLUE CROSS/BLUE SHIELD | Admitting: Ophthalmology

## 2014-03-19 DIAGNOSIS — H4423 Degenerative myopia, bilateral: Secondary | ICD-10-CM

## 2014-03-19 DIAGNOSIS — H3531 Nonexudative age-related macular degeneration: Secondary | ICD-10-CM

## 2014-03-19 DIAGNOSIS — H43813 Vitreous degeneration, bilateral: Secondary | ICD-10-CM

## 2014-03-22 ENCOUNTER — Ambulatory Visit (INDEPENDENT_AMBULATORY_CARE_PROVIDER_SITE_OTHER): Payer: BLUE CROSS/BLUE SHIELD | Admitting: Internal Medicine

## 2014-03-22 ENCOUNTER — Encounter: Payer: Self-pay | Admitting: Internal Medicine

## 2014-03-22 VITALS — BP 100/67 | HR 73 | Temp 98.0°F | Ht 67.0 in | Wt 178.2 lb

## 2014-03-22 DIAGNOSIS — R079 Chest pain, unspecified: Secondary | ICD-10-CM

## 2014-03-22 NOTE — Patient Instructions (Signed)
We will schedule a stress test Call if anything changes Motrin as needed for pain

## 2014-03-22 NOTE — Progress Notes (Signed)
Pre visit review using our clinic review tool, if applicable. No additional management support is needed unless otherwise documented below in the visit note. 

## 2014-03-22 NOTE — Assessment & Plan Note (Addendum)
44 year old gentleman with no family history of heart disease, no hypertension, no diabetes and LDL of 120  with atypical chest pain, he is very concerned about CAD. EKG: Despite several attempts the EKGs was poor quality, I see  a J-point elevation , otherwise ekg  normal. We agreed to proceed w/ a stress test (treadmill) to r/o occult CAD, patient will call me if symptoms change in any way.

## 2014-03-22 NOTE — Progress Notes (Signed)
Subjective:    Patient ID: Benjamin Burgess, male    DOB: 28-Oct-1970, 44 y.o.   MRN: [id number]  DOS:  03/22/2014 Type of visit - description : acute Interval history: One-week history of pain at the left upper chest, symptoms are more noticeable when he is under stress and less noticeable when he is distracted or relax. Some radiation to the left shoulder but not to the neck. Not associated nausea or diaphoresis. No  injury although often times he needs to reach back w/ his left arm to close his car door. Had similar symptoms a year ago, they self resolve.   Review of Systems  Denies fever, chills, cough. Occasional GERD symptoms, no difficulty breathing. He is a frequent flyer, last airplane trip a week ago. Denies any calf pains or swelling. He is very active, on any given week he runs 3 or 4 times, often times 10 miles without chest pain or difficulty breathing  Past Medical History  Diagnosis Date  . Rosacea   . Migraine   . Age-related macular degeneration   . Internal hemorrhoids     Past Surgical History  Procedure Laterality Date  . Refractive surgery  1998  . Vasectomy  2011  . Appendectomy  1987    History   Social History  . Marital Status: Married    Spouse Name: N/A  . Number of Children: 2  . Years of Education: N/A   Occupational History  .  [employer]   Social History Main Topics  . Smoking status: Never Smoker   . Smokeless tobacco: Never Used  . Alcohol Use: 0.5 oz/week    1 drink(s) per week     Comment: socially   . Drug Use: No  . Sexual Activity: Not on file   Other Topics Concern  . Not on file   Social History Narrative   original from [place], moved to [place] 2008   Married, 2 children, [name]  2007 , [name] 2011                  Medication List       This list is accurate as of: 03/22/14  9:05 PM.  Always use your most recent med list.               clindamycin 1 % Swab  Commonly known as:  CLEOCIN T  APPLY  TO AFFECTED AREAS ON FACE 1-2 TIMES A DAY     metroNIDAZOLE 0.75 % cream  Commonly known as:  METROCREAM  APPLY TO SKIN TWICE A DAY TO AFFECTED AREAS     minocycline 50 MG capsule  Commonly known as:  MINOCIN,DYNACIN  Take 1 capsule (50 mg total) by mouth 2 (two) times daily.           Objective:   Physical Exam  Constitutional: He is oriented to person, place, and time. He appears well-developed. No distress.  HENT:  Head: Normocephalic and atraumatic.  Cardiovascular:  RRR, no murmur, rub or gallop  Pulmonary/Chest: Effort normal. No respiratory distress.  CTA B  Musculoskeletal: Normal range of motion. He exhibits no edema (calves symmetric and not tender) or tenderness.       Arms: Neurological: He is alert and oriented to person, place, and time. No cranial nerve deficit. He exhibits normal muscle tone. Coordination normal.  Speech normal, gait unassisted and normal for age, motor strength appropriate for age   Skin: Skin is warm and dry. No  pallor.  Psychiatric: He has a normal mood and affect. His behavior is normal. Judgment and thought content normal.  Vitals reviewed.        Assessment & Plan:   Problem List Items Addressed This Visit      Other   Chest pain - Primary    44 year old gentleman with no family history of heart disease, no hypertension, no diabetes and LDL of 120% with atypical chest pain, he is very concerned about CAD. EKG: Despite several attempts the EKGs was poor quality, I see  a J-point elevation otherwise normal. We agreed to proceed w/ a stress test (treadmill)       Relevant Orders   EKG 12-Lead (Completed)

## 2014-03-23 ENCOUNTER — Ambulatory Visit: Payer: Self-pay | Admitting: Internal Medicine

## 2014-03-27 ENCOUNTER — Encounter: Payer: Self-pay | Admitting: Internal Medicine

## 2014-04-24 ENCOUNTER — Encounter (HOSPITAL_COMMUNITY): Payer: BLUE CROSS/BLUE SHIELD

## 2014-04-25 ENCOUNTER — Telehealth (HOSPITAL_COMMUNITY): Payer: Self-pay

## 2014-04-25 NOTE — Telephone Encounter (Signed)
Encounter complete. 

## 2014-04-27 ENCOUNTER — Ambulatory Visit (HOSPITAL_COMMUNITY)
Admission: RE | Admit: 2014-04-27 | Discharge: 2014-04-27 | Disposition: A | Discharge status: Home / Self Care | Payer: BLUE CROSS/BLUE SHIELD | Source: Ambulatory Visit | Attending: Internal Medicine | Admitting: Internal Medicine

## 2014-04-27 DIAGNOSIS — R079 Chest pain, unspecified: Secondary | ICD-10-CM | POA: Diagnosis present

## 2014-04-27 NOTE — Procedures (Signed)
Exercise Treadmill Test  Pre-Exercise Testing Evaluation Rhythm: normal sinus                  Test  Exercise Tolerance Test Ordering MD: Willow OraJose Paz, MD  Interpreting MD:   Unique Test No: 1 Treadmill:  1  Indication for ETT: chest pain - rule out ischemia  Contraindication to ETT: No   Stress Modality: exercise - treadmill  Cardiac Imaging Performed: non   Protocol: standard Bruce - maximal  Max BP:  179/124?  Max MPHR (bpm):  178 85% MPR (bpm):  151  MPHR obtained (bpm):  166 % MPHR obtained:  93  Reached 85% MPHR (min:sec):  14:00 Total Exercise Time (min-sec):  17:00  Workload in METS:  20.30 Borg Scale:   Reason ETT Terminated:  fatigue    ST Segment Analysis At Rest: normal ST segments - no evidence of significant ST depression With Exercise: no evidence of significant ST depression  Other Information Arrhythmia:  No Angina during ETT:  absent (0) Quality of ETT:  diagnostic  ETT Interpretation:  normal - no evidence of ischemia by ST analysis  Comments: Nl GXT  Recommendations: Follow up with Dr. Drue NovelPaz

## 2014-05-25 ENCOUNTER — Encounter (INDEPENDENT_AMBULATORY_CARE_PROVIDER_SITE_OTHER): Payer: BLUE CROSS/BLUE SHIELD | Admitting: Ophthalmology

## 2014-06-04 ENCOUNTER — Encounter (INDEPENDENT_AMBULATORY_CARE_PROVIDER_SITE_OTHER): Payer: BLUE CROSS/BLUE SHIELD | Admitting: Ophthalmology

## 2014-06-04 DIAGNOSIS — H4423 Degenerative myopia, bilateral: Secondary | ICD-10-CM

## 2014-06-04 DIAGNOSIS — H3531 Nonexudative age-related macular degeneration: Secondary | ICD-10-CM

## 2014-06-04 DIAGNOSIS — H43813 Vitreous degeneration, bilateral: Secondary | ICD-10-CM

## 2014-06-18 ENCOUNTER — Encounter: Payer: BC Managed Care – PPO | Admitting: Internal Medicine

## 2014-07-04 ENCOUNTER — Other Ambulatory Visit: Payer: Self-pay | Admitting: Internal Medicine

## 2014-07-04 NOTE — Telephone Encounter (Signed)
Ok x 6 

## 2014-07-04 NOTE — Telephone Encounter (Signed)
Rx sent to [facility].  

## 2014-07-04 NOTE — Telephone Encounter (Signed)
Pt is requesting refill on Clindamycin.  Last OV: 03/22/2014 Last Fill: 12/12/2013 #[id number]  Okay to refill?

## 2014-08-02 ENCOUNTER — Encounter (INDEPENDENT_AMBULATORY_CARE_PROVIDER_SITE_OTHER): Payer: BLUE CROSS/BLUE SHIELD | Admitting: Ophthalmology

## 2014-09-26 ENCOUNTER — Telehealth: Payer: Self-pay

## 2014-09-26 NOTE — Telephone Encounter (Signed)
Pre visit call completed 

## 2014-09-27 ENCOUNTER — Encounter: Payer: Self-pay | Admitting: Internal Medicine

## 2014-09-27 ENCOUNTER — Ambulatory Visit (INDEPENDENT_AMBULATORY_CARE_PROVIDER_SITE_OTHER): Payer: BLUE CROSS/BLUE SHIELD | Admitting: Internal Medicine

## 2014-09-27 VITALS — BP 102/56 | HR 67 | Temp 98.0°F | Ht 67.0 in | Wt 172.4 lb

## 2014-09-27 DIAGNOSIS — Z0000 Encounter for general adult medical examination without abnormal findings: Secondary | ICD-10-CM | POA: Diagnosis not present

## 2014-09-27 LAB — AST: AST: 40 U/L — ABNORMAL HIGH (ref 0–37)

## 2014-09-27 LAB — ALT: ALT: 47 U/L (ref 0–53)

## 2014-09-27 MED ORDER — CLINDAMYCIN PHOSPHATE 1 % EX SWAB: CUTANEOUS | Status: DC

## 2014-09-27 MED ORDER — MINOCYCLINE HCL 50 MG PO CAPS: 50.0000 mg | ORAL_CAPSULE | Freq: Two times a day (BID) | ORAL | Status: DC

## 2014-09-27 MED ORDER — METRONIDAZOLE 0.75 % EX CREA: TOPICAL_CREAM | CUTANEOUS | Status: DC

## 2014-09-27 NOTE — Progress Notes (Signed)
Pre visit review using our clinic review tool, if applicable. No additional management support is needed unless otherwise documented below in the visit note. 

## 2014-09-27 NOTE — Assessment & Plan Note (Addendum)
Td 2009 Has an extremely healthy lifestyle He has a family history of colon cancer, was recommended by GI to start a colonoscopy at age 44  Prostate cancer screening: Not indicated at this time Other issues:  Sees eye MD, h/o mac deg, stable Rosacea stable, request a refill medications, uses topical - oral medications prn Multiple skin lesions around the neck: Already has an appointment to see dermatology

## 2014-09-27 NOTE — Progress Notes (Signed)
Subjective:    Patient ID: Benjamin Burgess, male    DOB: 07/17/1970, 44 y.o.   MRN: [id number]  DOS:  09/27/2014 Type of visit - description : cpx  Interval history: He remains very active, request a rf on  medications. Has a number of skin lesions around the neck, concern about them.    Review of Systems  Constitutional: No fever. No chills. No unexplained wt changes. No unusual sweats  HEENT: No dental problems, no ear discharge, no facial swelling, no voice changes. No eye discharge, no eye  redness , no  intolerance to light   Respiratory: No wheezing , no  difficulty breathing. No cough , no mucus production  Cardiovascular: No CP, no leg swelling , no  Palpitations  GI: no nausea, no vomiting, no diarrhea , no  abdominal pain.  No blood in the stools. No dysphagia, no odynophagia    Endocrine: No polyphagia, no polyuria , no polydipsia  GU: No dysuria, gross hematuria, difficulty urinating. No urinary urgency, no frequency.  Musculoskeletal: No joint swellings or unusual aches or pains  Skin: No change in the color of the skin, palor , no  Rash  Allergic, immunologic: No environmental allergies , no  food allergies  Neurological: No dizziness no  syncope. No headaches. No diplopia, no slurred, no slurred speech, no motor deficits, no facial  Numbness  Hematological: No enlarged lymph nodes, no easy bruising , no unusual bleedings  Psychiatry: No suicidal ideas, no hallucinations, no beavior problems, no confusion.  No unusual/severe anxiety, no depression  Past Medical History  Diagnosis Date  . Rosacea   . Migraine   . Age-related macular degeneration   . Internal hemorrhoids   . Normal cardiac stress test 3-206    Past Surgical History  Procedure Laterality Date  . Refractive surgery  1998  . Vasectomy  2011  . Appendectomy  1987    Social History   Social History  . Marital Status: Married    Spouse Name: N/A  . Number of Children: 2  . Years  of Education: N/A   Occupational History  .  [employer]   Social History Main Topics  . Smoking status: Never Smoker   . Smokeless tobacco: Never Used  . Alcohol Use: 0.5 oz/week    1 drink(s) per week     Comment: socially   . Drug Use: No  . Sexual Activity: Not on file   Other Topics Concern  . Not on file   Social History Narrative   original from [place], moved to [place] 2008   Married, 2 children, [name]  2007 , [name] 2011               Family History  Problem Relation Age of Onset  . Diabetes Mother   . Colon cancer Maternal Aunt 50    died from colon cancer  . Colon cancer Maternal Uncle 47    died from colon cancer  . Stomach cancer Neg Hx   . Prostate cancer Neg Hx   . CAD Neg Hx        Medication List       This list is accurate as of: 09/27/14  4:58 PM.  Always use your most recent med list.               clindamycin 1 % Swab  Commonly known as:  CLEOCIN T  Apply to affected areas of face 1-2 times daily.  metroNIDAZOLE 0.75 % cream  Commonly known as:  METROCREAM  APPLY TO SKIN TWICE A DAY TO AFFECTED AREAS     minocycline 50 MG capsule  Commonly known as:  MINOCIN,DYNACIN  Take 1 capsule (50 mg total) by mouth 2 (two) times daily.           Objective:   Physical Exam BP 102/56 mmHg  Pulse 67  Temp(Src) 98 F (36.7 C) (Oral)  Ht  (1.702 m)  Wt 172 lb 6 oz (78.189 kg)  BMI 26.99 kg/m2  SpO2 97% General:   Well developed, well nourished . NAD.  Neck:  Full range of motion. Supple. No  thyromegaly   HEENT:  Normocephalic . Face symmetric, atraumatic Lungs:  CTA B Normal respiratory effort, no intercostal retractions, no accessory muscle use. Heart: RRR,  no murmur.  No pretibial edema bilaterally  Abdomen:  Not distended, soft, non-tender. No rebound or rigidity. No mass,organomegaly Skin: Exposed areas without rash. Not pale. Not jaundice. Around the neck and upper back  has a variety of skin lesions  including  skin tags, moles. Neurologic:  alert & oriented X3.  Speech normal, gait appropriate for age and unassisted Strength symmetric and appropriate for age.  Psych: Cognition and judgment appear intact.  Cooperative with normal attention span and concentration.  Behavior appropriate. No anxious or depressed appearing.    Assessment & Plan:

## 2014-09-27 NOTE — Patient Instructions (Signed)
Get your blood work before you leave    

## 2014-09-28 LAB — HIV ANTIBODY (ROUTINE TESTING W REFLEX): HIV 1&2 Ab, 4th Generation: NONREACTIVE

## 2014-11-05 ENCOUNTER — Encounter (INDEPENDENT_AMBULATORY_CARE_PROVIDER_SITE_OTHER): Payer: BLUE CROSS/BLUE SHIELD | Admitting: Ophthalmology

## 2014-11-05 DIAGNOSIS — H43813 Vitreous degeneration, bilateral: Secondary | ICD-10-CM | POA: Diagnosis not present

## 2014-11-05 DIAGNOSIS — H4423 Degenerative myopia, bilateral: Secondary | ICD-10-CM | POA: Diagnosis not present

## 2015-02-10 HISTORY — PX: FINGER EXPLORATION: SHX1635

## 2015-03-04 ENCOUNTER — Other Ambulatory Visit: Payer: Self-pay | Admitting: Internal Medicine

## 2015-03-04 MED ORDER — CLINDAMYCIN PHOSPHATE 1 % EX SWAB: CUTANEOUS | Status: DC

## 2015-03-04 MED ORDER — METRONIDAZOLE 0.75 % EX CREA: TOPICAL_CREAM | CUTANEOUS | Status: DC

## 2015-03-04 MED ORDER — MINOCYCLINE HCL 50 MG PO CAPS: 50.0000 mg | ORAL_CAPSULE | Freq: Two times a day (BID) | ORAL | Status: DC

## 2015-06-10 ENCOUNTER — Encounter (INDEPENDENT_AMBULATORY_CARE_PROVIDER_SITE_OTHER): Payer: BLUE CROSS/BLUE SHIELD | Admitting: Ophthalmology

## 2015-06-10 DIAGNOSIS — H318 Other specified disorders of choroid: Secondary | ICD-10-CM | POA: Diagnosis not present

## 2015-06-10 DIAGNOSIS — H4423 Degenerative myopia, bilateral: Secondary | ICD-10-CM | POA: Diagnosis not present

## 2015-06-10 DIAGNOSIS — H43813 Vitreous degeneration, bilateral: Secondary | ICD-10-CM

## 2015-06-13 ENCOUNTER — Encounter (INDEPENDENT_AMBULATORY_CARE_PROVIDER_SITE_OTHER): Payer: BLUE CROSS/BLUE SHIELD | Admitting: Ophthalmology

## 2015-07-16 ENCOUNTER — Ambulatory Visit: Payer: BLUE CROSS/BLUE SHIELD | Admitting: Family Medicine

## 2015-07-17 ENCOUNTER — Encounter (INDEPENDENT_AMBULATORY_CARE_PROVIDER_SITE_OTHER): Payer: BLUE CROSS/BLUE SHIELD | Admitting: Ophthalmology

## 2015-07-17 DIAGNOSIS — H43813 Vitreous degeneration, bilateral: Secondary | ICD-10-CM

## 2015-07-17 DIAGNOSIS — H318 Other specified disorders of choroid: Secondary | ICD-10-CM | POA: Diagnosis not present

## 2015-07-17 DIAGNOSIS — H4423 Degenerative myopia, bilateral: Secondary | ICD-10-CM

## 2015-07-19 ENCOUNTER — Encounter (INDEPENDENT_AMBULATORY_CARE_PROVIDER_SITE_OTHER): Payer: BLUE CROSS/BLUE SHIELD | Admitting: Ophthalmology

## 2015-09-18 ENCOUNTER — Encounter (INDEPENDENT_AMBULATORY_CARE_PROVIDER_SITE_OTHER): Payer: BLUE CROSS/BLUE SHIELD | Admitting: Ophthalmology

## 2015-09-30 IMAGING — CR DG TIBIA/FIBULA 2V*L*
4 series · 4 of 4 positions shown · non-contrast
Comparison: None.

CLINICAL DATA: Animal bite to lower leg

EXAM:
LEFT TIBIA AND FIBULA - 2 VIEW

[t tib/fib ap left (1 of 2)]
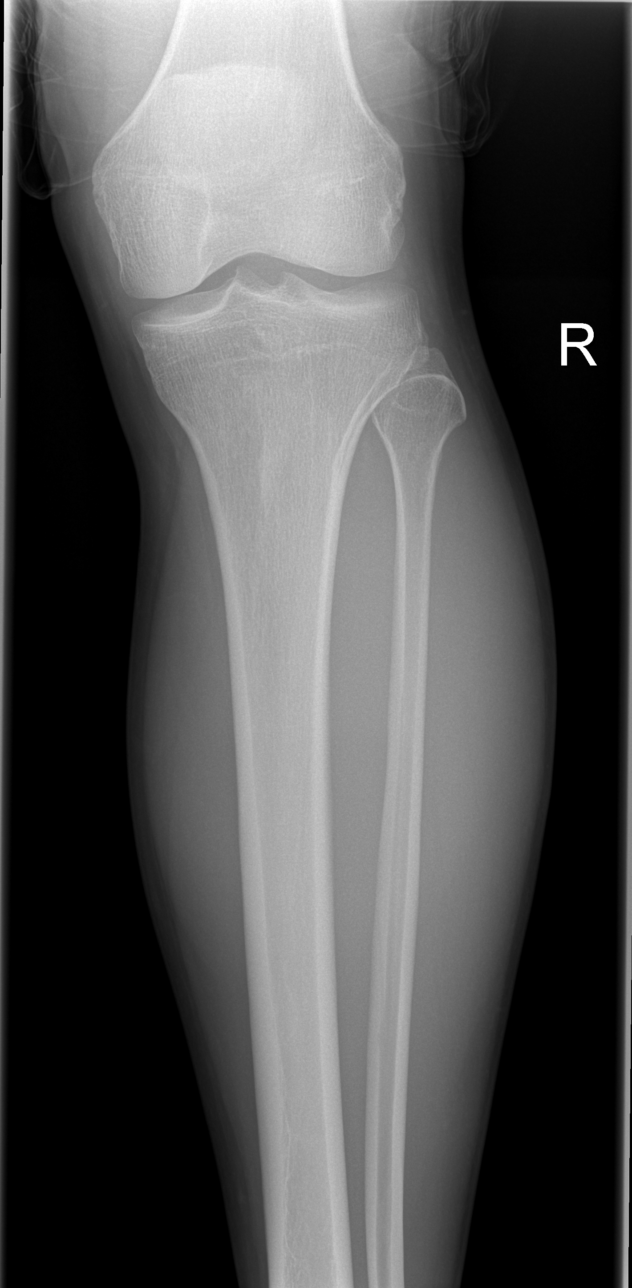

[t tib/fib ap left (2 of 2)]
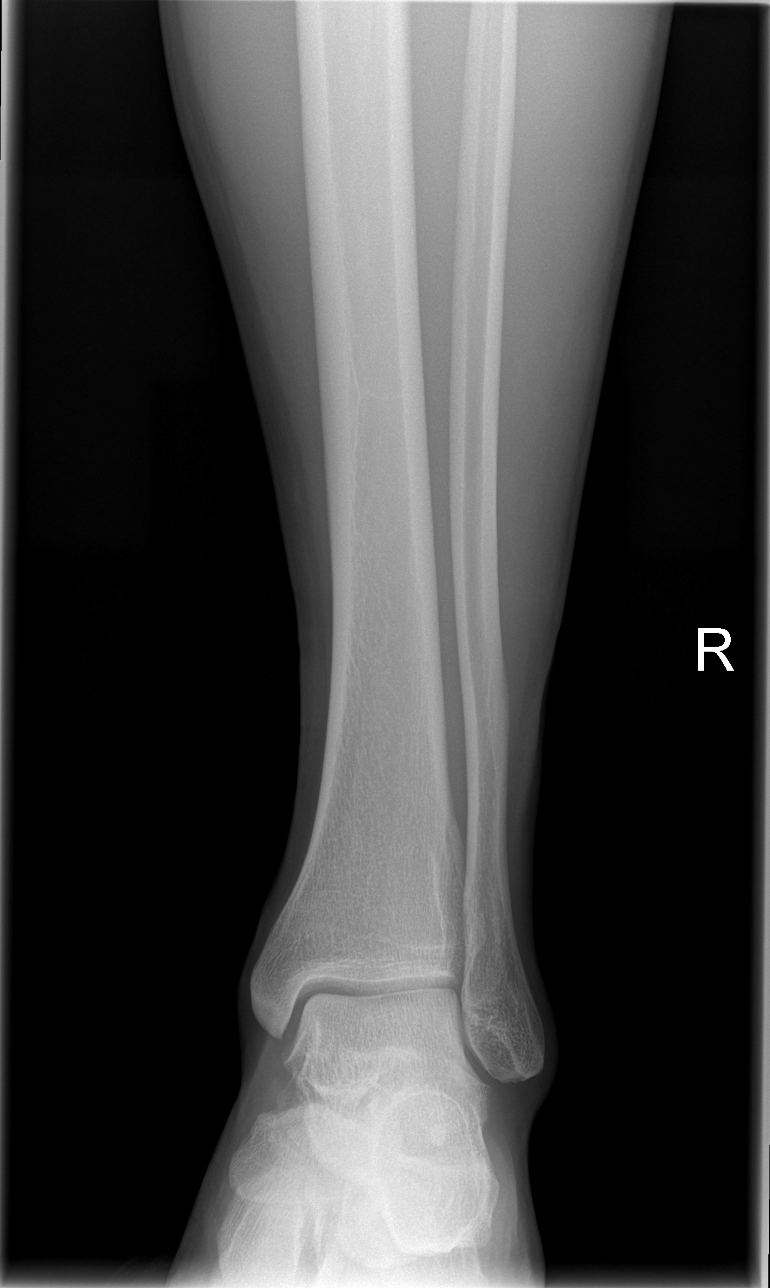

[t tib/fib lat left (1 of 2)]
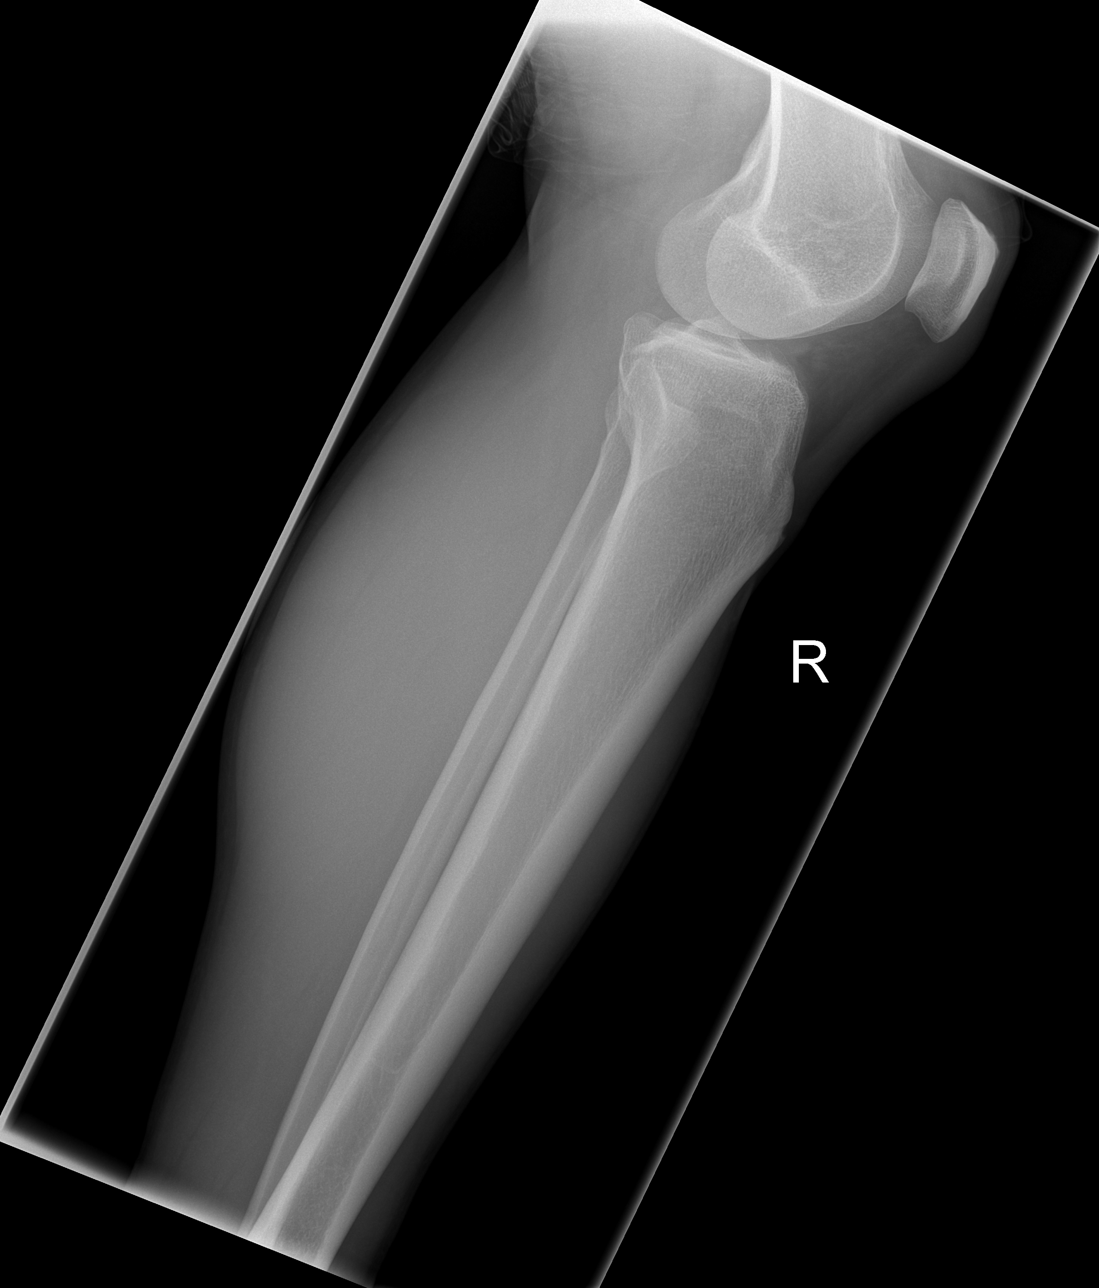

[t tib/fib lat left (2 of 2)]
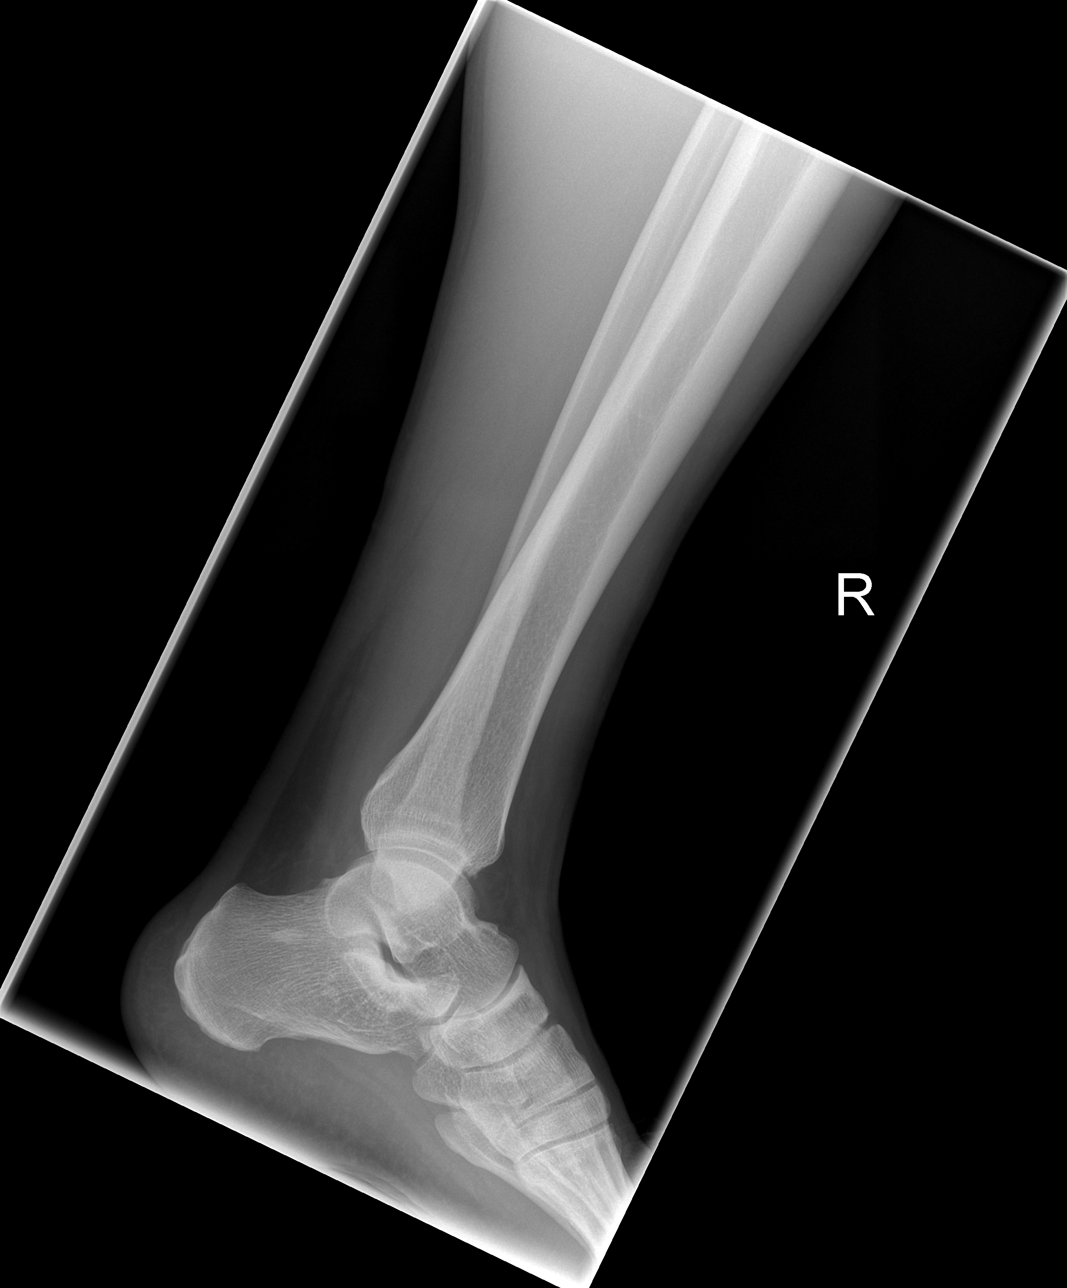

[4 of 4 positions shown; findings below may reference images not displayed]

FINDINGS: There is no evidence of fracture or other focal bone lesions. Soft
tissues are unremarkable. No radiopaque foreign bodies. No soft
tissue gas.
IMPRESSION: Negative.

## 2015-10-01 ENCOUNTER — Encounter: Payer: BLUE CROSS/BLUE SHIELD | Admitting: Internal Medicine

## 2016-02-17 ENCOUNTER — Encounter: Payer: Self-pay | Admitting: Internal Medicine

## 2016-02-17 ENCOUNTER — Ambulatory Visit (INDEPENDENT_AMBULATORY_CARE_PROVIDER_SITE_OTHER): Payer: BLUE CROSS/BLUE SHIELD | Admitting: Internal Medicine

## 2016-02-17 VITALS — BP 108/74 | HR 69 | Temp 98.3°F | Resp 14 | Ht 67.0 in | Wt 179.2 lb

## 2016-02-17 DIAGNOSIS — Z0000 Encounter for general adult medical examination without abnormal findings: Secondary | ICD-10-CM

## 2016-02-17 MED ORDER — LIDOCAINE 5 % EX OINT: 1.0000 "application " | TOPICAL_OINTMENT | CUTANEOUS | 0 refills | Status: AC | PRN

## 2016-02-17 NOTE — Patient Instructions (Signed)
GO TO THE LAB : Get the blood work     GO TO THE FRONT DESK Schedule your next appointment for a  routine checkup physical exam in one year  For  hemorrhoids: Increase your fiber intake or take Metamucil one or 2 capsules daily  Use the ointment as needed for itching  Call if severe symptoms     Hemorrhoids Hemorrhoids are swollen veins in and around the rectum or anus. There are two types of hemorrhoids:  Internal hemorrhoids. These occur in the veins that are just inside the rectum. They may poke through to the outside and become irritated and painful.  External hemorrhoids. These occur in the veins that are outside of the anus and can be felt as a painful swelling or hard lump near the anus. Most hemorrhoids do not cause serious problems, and they can be managed with home treatments such as diet and lifestyle changes. If home treatments do not help your symptoms, procedures can be done to shrink or remove the hemorrhoids. What are the causes? This condition is caused by increased pressure in the anal area. This pressure may result from various things, including:  Constipation.  Straining to have a bowel movement.  Diarrhea.  Pregnancy.  Obesity.  Sitting for long periods of time.  Heavy lifting or other activity that causes you to strain.  Anal sex. What are the signs or symptoms? Symptoms of this condition include:  Pain.  Anal itching or irritation.  Rectal bleeding.  Leakage of stool (feces).  Anal swelling.  One or more lumps around the anus. How is this diagnosed? This condition can often be diagnosed through a visual exam. Other exams or tests may also be done, such as:  Examination of the rectal area with a gloved hand (digital rectal exam).  Examination of the anal canal using a small tube (anoscope).  A blood test, if you have lost a significant amount of blood.  A test to look inside the colon (sigmoidoscopy or colonoscopy). How is this  treated? This condition can usually be treated at home. However, various procedures may be done if dietary changes, lifestyle changes, and other home treatments do not help your symptoms. These procedures can help make the hemorrhoids smaller or remove them completely. Some of these procedures involve surgery, and others do not. Common procedures include:  Rubber band ligation. Rubber bands are placed at the base of the hemorrhoids to cut off the blood supply to them.  Sclerotherapy. Medicine is injected into the hemorrhoids to shrink them.  Infrared coagulation. A type of light energy is used to get rid of the hemorrhoids.  Hemorrhoidectomy surgery. The hemorrhoids are surgically removed, and the veins that supply them are tied off.  Stapled hemorrhoidopexy surgery. A circular stapling device is used to remove the hemorrhoids and use staples to cut off the blood supply to them. Follow these instructions at home: Eating and drinking  Eat foods that have a lot of fiber in them, such as whole grains, beans, nuts, fruits, and vegetables. Ask your health care provider about taking products that have added fiber (fiber supplements).  Drink enough fluid to keep your urine clear or pale yellow. Managing pain and swelling  Take warm sitz baths for 20 minutes, 3-4 times a day to ease pain and discomfort.  If directed, apply ice to the affected area. Using ice packs between sitz baths may be helpful.  Put ice in a plastic bag.  Place a towel between your skin  and the bag.  Leave the ice on for 20 minutes, 2-3 times a day. General instructions  Take over-the-counter and prescription medicines only as told by your health care provider.  Use medicated creams or suppositories as told.  Exercise regularly.  Go to the bathroom when you have the urge to have a bowel movement. Do not wait.  Avoid straining to have bowel movements.  Keep the anal area dry and clean. Use wet toilet paper or  moist towelettes after a bowel movement.  Do not sit on the toilet for long periods of time. This increases blood pooling and pain. Contact a health care provider if:  You have increasing pain and swelling that are not controlled by treatment or medicine.  You have uncontrolled bleeding.  You have difficulty having a bowel movement, or you are unable to have a bowel movement.  You have pain or inflammation outside the area of the hemorrhoids. This information is not intended to replace advice given to you by your health care provider. Make sure you discuss any questions you have with your health care provider. Document Released: 01/24/2000 Document Revised: 06/26/2015 Document Reviewed: 10/10/2014 Elsevier Interactive Patient Education  2017 [employer]

## 2016-02-17 NOTE — Progress Notes (Signed)
Subjective:    Patient ID: Benjamin Burgess, male    DOB: 07-21-1970, 46 y.o.   MRN: [id number]  DOS:  02/17/2016 Type of visit - description : CPX Interval history: No major concern    Review of Systems Constitutional: No fever. No chills. No unexplained wt changes. No unusual sweats  HEENT: No dental problems, no ear discharge, no facial swelling, no voice changes. No eye discharge, no eye  redness , no  intolerance to light   Respiratory: No wheezing , no  difficulty breathing. No cough , no mucus production  Cardiovascular: No CP, no leg swelling , no  Palpitations  GI: no nausea, no vomiting, no diarrhea , no  abdominal pain.  No blood in the stools. No dysphagia, no odynophagia   On and off  rectal itching, sometimes severe, from time to time sees  few drops of red blood when he wipes. No discomfort or pain. h/o hemorrhoids. Endocrine: No polyphagia, no polyuria , no polydipsia  GU: No dysuria, gross hematuria, difficulty urinating. No urinary urgency, no frequency.  Musculoskeletal: No joint swellings or unusual aches or pains  Skin: No change in the color of the skin, palor , no  Rash  Allergic, immunologic: No environmental allergies , no  food allergies  Neurological: No dizziness no  syncope. No headaches. No diplopia, no slurred, no slurred speech, no motor deficits, no facial  Numbness  Hematological: No enlarged lymph nodes, no easy bruising , no unusual bleedings  Psychiatry: No suicidal ideas, no hallucinations, no beavior problems, no confusion.  No unusual/severe anxiety, no depression   Past Medical History:  Diagnosis Date  . Age-related macular degeneration   . Internal hemorrhoids   . Migraine   . Normal cardiac stress test 3-206  . Rosacea     Past Surgical History:  Procedure Laterality Date  . APPENDECTOMY  1987  . FINGER EXPLORATION Left 2017   L thumb tendon repair   . [place]  . VASECTOMY  2011    Social History    Social History  . Marital status: Married    Spouse name: N/A  . Number of children: 2  . Years of education: N/A   Occupational History  .  [employer]   Social History Main Topics  . Smoking status: Never Smoker  . Smokeless tobacco: Never Used  . Alcohol use 0.5 oz/week    1 drink(s) per week     Comment: socially   . Drug use: No  . Sexual activity: Not on file   Other Topics Concern  . Not on file   Social History Narrative   original from [place], moved to [place] 2008   Married, 2 children, Venora Maples  2007 , [name] 2011               Family History  Problem Relation Age of Onset  . Colon cancer Maternal Aunt 47    died from colon cancer  . Colon cancer Maternal Uncle 50    died from colon cancer  . Diabetes Mother   . Stomach cancer Neg Hx   . Prostate cancer Neg Hx   . CAD Neg Hx      Allergies as of 02/17/2016   No Known Allergies     Medication List       Accurate as of 02/17/16 11:59 PM. Always use your most recent med list.          clindamycin  1 % Swab Commonly known as:  CLEOCIN T Apply to affected areas of face 1-2 times daily.   lidocaine 5 % ointment Commonly known as:  XYLOCAINE Apply 1 application topically as needed.   metroNIDAZOLE 0.75 % cream Commonly known as:  METROCREAM APPLY TO SKIN TWICE A DAY TO AFFECTED AREAS   minocycline 50 MG capsule Commonly known as:  MINOCIN,DYNACIN Take 1 capsule (50 mg total) by mouth 2 (two) times daily.          Objective:   Physical Exam BP 108/74 (BP Location: Left Arm, Patient Position: Sitting, Cuff Size: Normal)   Pulse 69   Temp 98.3 F (36.8 C) (Oral)   Resp 14   Ht 5' 7"  (1.702 m)   Wt 179 lb 4 oz (81.3 kg)   SpO2 97%   BMI 28.07 kg/m    General:   Well developed, well nourished . NAD.  Neck: No  thyromegaly  HEENT:  Normocephalic . Face symmetric, atraumatic Lungs:  CTA B Normal respiratory effort, no intercostal retractions, no accessory muscle  use. Heart: RRR,  no murmur.  No pretibial edema bilaterally  Abdomen:  Not distended, soft, non-tender. No rebound or rigidity.   Skin: Exposed areas without rash. Not pale. Not jaundice Rectal exam-- : External anal exam + for a skin tag, DRE normal including a normal prostate, anoscopy: Several small internal hemorrhoids Neurologic:  alert & oriented X3.  Speech normal, gait appropriate for age and unassisted Strength symmetric and appropriate for age.  Psych: Cognition and judgment appear intact.  Cooperative with normal attention span and concentration.  Behavior appropriate. No anxious or depressed appearing.    Assessment & Plan:   Assessment Rosacea Mac Degeneration  Hemorrhoids  Plan: Rosacea: Uses meds prn Hemorrhoids: Recommend High-fiber diet, lidocaine as needed, avoidance of triggers  (ie spicy foods ) RTC one year

## 2016-02-17 NOTE — Progress Notes (Signed)
Pre visit review using our clinic review tool, if applicable. No additional management support is needed unless otherwise documented below in the visit note. 

## 2016-02-17 NOTE — Assessment & Plan Note (Addendum)
Td 2009; declined flu shot He has a family history of colon cancer, was recommended by GI to start a colonoscopy at age 46 Prostate cancer screening: Not indicated at this time (although DRE was done today and prostate is wnl)   labs-- CMP, FLP, CBC, TSH    Life style- healthy except diet has not been the best in the last few weeks

## 2016-02-18 ENCOUNTER — Encounter: Payer: BLUE CROSS/BLUE SHIELD | Admitting: Internal Medicine

## 2016-02-18 DIAGNOSIS — Z09 Encounter for follow-up examination after completed treatment for conditions other than malignant neoplasm: Secondary | ICD-10-CM | POA: Insufficient documentation

## 2016-02-18 LAB — CBC WITH DIFFERENTIAL/PLATELET
BASOS ABS: 0 10*3/uL (ref 0.0–0.1)
Basophils Relative: 0.8 % (ref 0.0–3.0)
EOS ABS: 0.2 10*3/uL (ref 0.0–0.7)
Eosinophils Relative: 3.9 % (ref 0.0–5.0)
HCT: 44.3 % (ref 39.0–52.0)
Hemoglobin: 15.3 g/dL (ref 13.0–17.0)
LYMPHS ABS: 1.6 10*3/uL (ref 0.7–4.0)
LYMPHS PCT: 33.3 % (ref 12.0–46.0)
MCHC: 34.6 g/dL (ref 30.0–36.0)
MCV: 91.1 fl (ref 78.0–100.0)
Monocytes Absolute: 0.4 10*3/uL (ref 0.1–1.0)
Monocytes Relative: 7.7 % (ref 3.0–12.0)
NEUTROS ABS: 2.7 10*3/uL (ref 1.4–7.7)
NEUTROS PCT: 54.3 % (ref 43.0–77.0)
PLATELETS: 235 10*3/uL (ref 150.0–400.0)
RBC: 4.87 Mil/uL (ref 4.22–5.81)
RDW: 14 % (ref 11.5–15.5)
WBC: 4.9 10*3/uL (ref 4.0–10.5)

## 2016-02-18 LAB — COMPREHENSIVE METABOLIC PANEL
ALT: 25 U/L (ref 0–53)
AST: 29 U/L (ref 0–37)
Albumin: 4.5 g/dL (ref 3.5–5.2)
Alkaline Phosphatase: 81 U/L (ref 39–117)
BUN: 12 mg/dL (ref 6–23)
CALCIUM: 9.1 mg/dL (ref 8.4–10.5)
CHLORIDE: 105 meq/L (ref 96–112)
CO2: 27 meq/L (ref 19–32)
CREATININE: 0.94 mg/dL (ref 0.40–1.50)
GFR: 92.34 mL/min (ref >60.00)
GLUCOSE: 79 mg/dL (ref 70–99)
Potassium: 3.8 mEq/L (ref 3.5–5.1)
SODIUM: 139 meq/L (ref 135–145)
Total Bilirubin: 0.7 mg/dL (ref 0.2–1.2)
Total Protein: 7.5 g/dL (ref 6.0–8.3)

## 2016-02-18 LAB — LIPID PANEL
CHOL/HDL RATIO: 5
Cholesterol: 216 mg/dL — ABNORMAL HIGH (ref 0–200)
HDL: 47 mg/dL (ref >39.00)
LDL CALC: 148 mg/dL — AB (ref 0–99)
NONHDL: 169.44
TRIGLYCERIDES: 107 mg/dL (ref 0.0–149.0)
VLDL: 21.4 mg/dL (ref 0.0–40.0)

## 2016-02-18 LAB — TSH: TSH: 0.94 u[IU]/mL (ref 0.35–4.50)

## 2016-02-18 NOTE — Assessment & Plan Note (Signed)
Rosacea: Uses meds prn Hemorrhoids: Recommend High-fiber diet, lidocaine as needed, avoidance of triggers  (ie spicy foods ) RTC one year

## 2016-02-20 ENCOUNTER — Encounter (INDEPENDENT_AMBULATORY_CARE_PROVIDER_SITE_OTHER): Payer: BLUE CROSS/BLUE SHIELD | Admitting: Ophthalmology

## 2016-02-20 DIAGNOSIS — H43813 Vitreous degeneration, bilateral: Secondary | ICD-10-CM | POA: Diagnosis not present

## 2016-02-20 DIAGNOSIS — H4423 Degenerative myopia, bilateral: Secondary | ICD-10-CM | POA: Diagnosis not present

## 2016-02-20 DIAGNOSIS — H318 Other specified disorders of choroid: Secondary | ICD-10-CM | POA: Diagnosis not present

## 2016-02-24 ENCOUNTER — Encounter (INDEPENDENT_AMBULATORY_CARE_PROVIDER_SITE_OTHER): Payer: BLUE CROSS/BLUE SHIELD | Admitting: Ophthalmology

## 2016-02-25 ENCOUNTER — Encounter: Payer: BLUE CROSS/BLUE SHIELD | Admitting: Internal Medicine

## 2016-04-23 ENCOUNTER — Encounter (INDEPENDENT_AMBULATORY_CARE_PROVIDER_SITE_OTHER): Payer: BLUE CROSS/BLUE SHIELD | Admitting: Ophthalmology

## 2016-05-14 ENCOUNTER — Telehealth: Payer: Self-pay

## 2016-05-14 MED ORDER — MINOCYCLINE HCL 50 MG PO CAPS: 50.0000 mg | ORAL_CAPSULE | Freq: Two times a day (BID) | ORAL | 6 refills | Status: DC

## 2016-05-14 MED ORDER — METRONIDAZOLE 0.75 % EX CREA: TOPICAL_CREAM | CUTANEOUS | 6 refills | Status: DC

## 2016-05-14 MED ORDER — CLINDAMYCIN PHOSPHATE 1 % EX SWAB: CUTANEOUS | 6 refills | Status: DC

## 2016-05-14 NOTE — Telephone Encounter (Signed)
-----   Message from Wanda Plump, MD sent at 05/13/2016  1:58 PM EDT ----- Regarding: RF Please send a six-month supply of his medications except lidocaine. The pharmacy in the computer is correct

## 2016-05-14 NOTE — Telephone Encounter (Signed)
Rxs sent

## 2016-06-11 ENCOUNTER — Encounter (INDEPENDENT_AMBULATORY_CARE_PROVIDER_SITE_OTHER): Payer: BLUE CROSS/BLUE SHIELD | Admitting: Ophthalmology

## 2016-06-11 DIAGNOSIS — H4423 Degenerative myopia, bilateral: Secondary | ICD-10-CM

## 2016-06-11 DIAGNOSIS — H43813 Vitreous degeneration, bilateral: Secondary | ICD-10-CM | POA: Diagnosis not present

## 2016-06-11 DIAGNOSIS — H318 Other specified disorders of choroid: Secondary | ICD-10-CM | POA: Diagnosis not present

## 2016-08-17 ENCOUNTER — Telehealth: Payer: Self-pay

## 2016-08-17 NOTE — Telephone Encounter (Signed)
Form completed, will forward to [place] for scanning and emailing to Pt.

## 2016-08-17 NOTE — Telephone Encounter (Signed)
Received Healthcare Provider Form from Pt via fax, requesting form be completed and emailed to him at [email or web address].

## 2016-10-29 ENCOUNTER — Encounter (INDEPENDENT_AMBULATORY_CARE_PROVIDER_SITE_OTHER): Payer: BLUE CROSS/BLUE SHIELD | Admitting: Ophthalmology

## 2016-12-21 ENCOUNTER — Encounter (INDEPENDENT_AMBULATORY_CARE_PROVIDER_SITE_OTHER): Payer: BLUE CROSS/BLUE SHIELD | Admitting: Ophthalmology

## 2016-12-21 DIAGNOSIS — H4423 Degenerative myopia, bilateral: Secondary | ICD-10-CM | POA: Diagnosis not present

## 2016-12-21 DIAGNOSIS — H43813 Vitreous degeneration, bilateral: Secondary | ICD-10-CM | POA: Diagnosis not present

## 2016-12-21 DIAGNOSIS — H318 Other specified disorders of choroid: Secondary | ICD-10-CM | POA: Diagnosis not present

## 2017-02-18 ENCOUNTER — Encounter: Payer: BLUE CROSS/BLUE SHIELD | Admitting: Internal Medicine

## 2017-03-29 ENCOUNTER — Encounter: Payer: BLUE CROSS/BLUE SHIELD | Admitting: Internal Medicine

## 2017-04-08 ENCOUNTER — Encounter (INDEPENDENT_AMBULATORY_CARE_PROVIDER_SITE_OTHER): Payer: BLUE CROSS/BLUE SHIELD | Admitting: Ophthalmology

## 2017-06-09 ENCOUNTER — Encounter: Payer: Self-pay | Admitting: Internal Medicine

## 2017-06-09 ENCOUNTER — Ambulatory Visit (INDEPENDENT_AMBULATORY_CARE_PROVIDER_SITE_OTHER): Payer: BLUE CROSS/BLUE SHIELD | Admitting: Internal Medicine

## 2017-06-09 VITALS — BP 116/74 | HR 62 | Temp 98.2°F | Resp 14 | Ht 68.0 in | Wt 173.2 lb

## 2017-06-09 DIAGNOSIS — Z0000 Encounter for general adult medical examination without abnormal findings: Secondary | ICD-10-CM

## 2017-06-09 DIAGNOSIS — Z23 Encounter for immunization: Secondary | ICD-10-CM

## 2017-06-09 NOTE — Assessment & Plan Note (Addendum)
-  Td 06/2017 2009 --CCS: + FH colon cancer, was rec by GI before  to start a colonoscopy at age 46, however saw Dr Bosie Clos recently and rec to start at age 50. -Prostate cancer screening: Not indicated at this time  --Labs: CMP, FLP, CBC, TSH --He tries to eat healthy, remains very active.

## 2017-06-09 NOTE — Progress Notes (Signed)
Subjective:    Patient ID: Benjamin Burgess, male    DOB: 12-09-1970, 47 y.o.   MRN: [id number]  DOS:  06/09/2017 Type of visit - description : CPX Interval history: since the last office visit he is having occasional problem.  See review of systems  Wt Readings from Last 3 Encounters:  06/09/17 173 lb 4 oz (78.6 kg)  02/17/16 179 lb 4 oz (81.3 kg)  09/27/14 172 lb 6 oz (78.2 kg)     Review of Systems   3 times since last few years have acute, brief, few seconds episode of pain in the right lower quadrant of the abdomen, usually associated with Valsalva maneuver, not associated with a mass. Patient become concerned, went to see a general surgeon in [place] last month, exam was negative. Saw a GI last  month here in [place], was recommend observation. Denies fever, chills.  Weight has been stable. No nausea, vomiting, diarrhea.  No blood in the stools.  Other than above, a 14 point review of systems is negative    Past Medical History:  Diagnosis Date  . Age-related macular degeneration   . Internal hemorrhoids   . Migraine   . Normal cardiac stress test 3-206  . Rosacea     Past Surgical History:  Procedure Laterality Date  . APPENDECTOMY  1987  . FINGER EXPLORATION Left 2017   L thumb tendon repair   . [place]  . VASECTOMY  2011    Social History   Socioeconomic History  . Marital status: Married    Spouse name: Not on file  . Number of children: 2  . Years of education: Not on file  . Highest education level: Not on file  Occupational History    Employer: [employer]  Social Needs  . Financial resource strain: Not on file  . Food insecurity:    Worry: Not on file    Inability: Not on file  . Transportation needs:    Medical: Not on file    Non-medical: Not on file  Tobacco Use  . Smoking status: Never Smoker  . Smokeless tobacco: Never Used  Substance and Sexual Activity  . Alcohol use: Yes    Alcohol/week: 0.5 oz   Types: 1 drink(s) per week    Comment: socially   . Drug use: No  . Sexual activity: Not on file  Lifestyle  . Physical activity:    Days per week: Not on file    Minutes per session: Not on file  . Stress: Not on file  Relationships  . Social connections:    Talks on phone: Not on file    Gets together: Not on file    Attends religious service: Not on file    Active member of club or organization: Not on file    Attends meetings of clubs or organizations: Not on file    Relationship status: Not on file  . Intimate partner violence:    Fear of current or ex partner: Not on file    Emotionally abused: Not on file    Physically abused: Not on file    Forced sexual activity: Not on file  Other Topics Concern  . Not on file  Social History Narrative   original from [place], moved to [place] 2008   Married, 2 children, Andre  2007 , Ritta Slot 2011   Lives in [place]            Family History  Problem Relation Age of Onset  . Pancreatic cancer Maternal Aunt 56  . Colon cancer Maternal Uncle 45  . Diabetes Mother   . Stomach cancer Neg Hx   . Prostate cancer Neg Hx   . CAD Neg Hx      Allergies as of 06/09/2017   No Known Allergies     Medication List        Accurate as of 06/09/17 11:59 PM. Always use your most recent med list.          clindamycin 1 % Swab Commonly known as:  CLEOCIN T Apply to affected areas of face 1-2 times daily.   lidocaine 5 % ointment Commonly known as:  XYLOCAINE Apply 1 application topically as needed.   metroNIDAZOLE 0.75 % cream Commonly known as:  METROCREAM APPLY TO SKIN TWICE A DAY TO AFFECTED AREAS   minocycline 50 MG capsule Commonly known as:  MINOCIN,DYNACIN Take 1 capsule (50 mg total) by mouth 2 (two) times daily.          Objective:   Physical Exam BP 116/74 (BP Location: Left Arm, Patient Position: Sitting, Cuff Size: Small)   Pulse 62   Temp 98.2 F (36.8 C) (Oral)   Resp 14   Ht 5' 8"  (1.727 m)   Wt 173 lb 4 oz  (78.6 kg)   SpO2 98%   BMI 26.34 kg/m  General:   Well developed, well nourished . NAD.  Neck: No  thyromegaly  HEENT:  Normocephalic . Face symmetric, atraumatic Lungs:  CTA B Normal respiratory effort, no intercostal retractions, no accessory muscle use. Heart: RRR,  no murmur.  No pretibial edema bilaterally  Abdomen:  Not distended, soft, non-tender. No rebound or rigidity.  Well-healed surgical scar from appendectomy, no obvious hernia. Skin: Exposed areas without rash. Not pale. Not jaundice Neurologic:  alert & oriented X3.  Speech normal, gait appropriate for age and unassisted Strength symmetric and appropriate for age.  Psych: Cognition and judgment appear intact.  Cooperative with normal attention span and concentration.  Behavior appropriate. No anxious or depressed appearing.     Assessment & Plan:   Assessment Rosacea Mac Degeneration  Hemorrhoids  Plan: Rosacea: Uses meds prn, will call for RF. Actually using a herb (sabila) w/ great results Hemorrhoids: not an issue lately. RLQ abdominal pain, around the surgical scar, brief, 3 episodes in 4 years, saw surgery and GI recently, was recommended observation.  Symptoms are atypical, no red flags, I also recommend observation, could be possibly a colon spasm.  Call if symptoms increase. RTC one year

## 2017-06-09 NOTE — Progress Notes (Signed)
Pre visit review using our clinic review tool, if applicable. No additional management support is needed unless otherwise documented below in the visit note. 

## 2017-06-09 NOTE — Patient Instructions (Signed)
Sickle exam in 1 yearGO TO THE LAB : Get the blood work     GO TO THE FRONT DESK Schedule your next appointment for a   physical exam in 1 year

## 2017-06-10 LAB — CBC WITH DIFFERENTIAL/PLATELET
BASOS ABS: 0.1 10*3/uL (ref 0.0–0.1)
Basophils Relative: 1.3 % (ref 0.0–3.0)
Eosinophils Absolute: 0.2 10*3/uL (ref 0.0–0.7)
Eosinophils Relative: 4.1 % (ref 0.0–5.0)
HCT: 45 % (ref 39.0–52.0)
Hemoglobin: 15.5 g/dL (ref 13.0–17.0)
Lymphocytes Relative: 29.1 % (ref 12.0–46.0)
Lymphs Abs: 1.6 10*3/uL (ref 0.7–4.0)
MCHC: 34.5 g/dL (ref 30.0–36.0)
MCV: 92.8 fl (ref 78.0–100.0)
MONO ABS: 0.4 10*3/uL (ref 0.1–1.0)
Monocytes Relative: 8.2 % (ref 3.0–12.0)
NEUTROS ABS: 3.1 10*3/uL (ref 1.4–7.7)
NEUTROS PCT: 57.3 % (ref 43.0–77.0)
PLATELETS: 255 10*3/uL (ref 150.0–400.0)
RBC: 4.85 Mil/uL (ref 4.22–5.81)
RDW: 13.4 % (ref 11.5–15.5)
WBC: 5.4 10*3/uL (ref 4.0–10.5)

## 2017-06-10 LAB — LIPID PANEL
CHOL/HDL RATIO: 5
Cholesterol: 215 mg/dL — ABNORMAL HIGH (ref 0–200)
HDL: 46.3 mg/dL (ref >39.00)
LDL CALC: 146 mg/dL — AB (ref 0–99)
NonHDL: 168.25
TRIGLYCERIDES: 110 mg/dL (ref 0.0–149.0)
VLDL: 22 mg/dL (ref 0.0–40.0)

## 2017-06-10 LAB — COMPREHENSIVE METABOLIC PANEL
ALT: 18 U/L (ref 0–53)
AST: 25 U/L (ref 0–37)
Albumin: 4.6 g/dL (ref 3.5–5.2)
Alkaline Phosphatase: 83 U/L (ref 39–117)
BUN: 17 mg/dL (ref 6–23)
CALCIUM: 9.4 mg/dL (ref 8.4–10.5)
CHLORIDE: 103 meq/L (ref 96–112)
CO2: 29 meq/L (ref 19–32)
Creatinine, Ser: 1.06 mg/dL (ref 0.40–1.50)
GFR: 79.91 mL/min (ref >60.00)
Glucose, Bld: 85 mg/dL (ref 70–99)
Potassium: 4 mEq/L (ref 3.5–5.1)
Sodium: 140 mEq/L (ref 135–145)
Total Bilirubin: 0.7 mg/dL (ref 0.2–1.2)
Total Protein: 7.5 g/dL (ref 6.0–8.3)

## 2017-06-10 LAB — URINALYSIS, ROUTINE W REFLEX MICROSCOPIC
BILIRUBIN URINE: NEGATIVE
LEUKOCYTES UA: NEGATIVE
NITRITE: NEGATIVE
SPECIFIC GRAVITY, URINE: 1.025 (ref 1.000–1.030)
Total Protein, Urine: NEGATIVE
UROBILINOGEN UA: 0.2 (ref 0.0–1.0)
Urine Glucose: NEGATIVE
pH: 6.5 (ref 5.0–8.0)

## 2017-06-10 LAB — TSH: TSH: 1.15 u[IU]/mL (ref 0.35–4.50)

## 2017-06-11 NOTE — Assessment & Plan Note (Signed)
Rosacea: Uses meds prn, will call for RF. Actually using a herb (sabila) w/ great results Hemorrhoids: not an issue lately. RLQ abdominal pain, around the surgical scar, brief, 3 episodes in 4 years, saw surgery and GI recently, was recommended observation.  Symptoms are atypical, no red flags, I also recommend observation, could be possibly a colon spasm.  Call if symptoms increase. RTC one year

## 2017-09-14 ENCOUNTER — Other Ambulatory Visit: Payer: Self-pay

## 2017-09-14 MED ORDER — CLINDAMYCIN PHOSPHATE 1 % EX SWAB: CUTANEOUS | 6 refills | Status: AC

## 2017-09-14 MED ORDER — METRONIDAZOLE 0.75 % EX CREA: TOPICAL_CREAM | CUTANEOUS | 6 refills | Status: AC

## 2017-09-14 MED ORDER — MINOCYCLINE HCL 50 MG PO CAPS: 50.0000 mg | ORAL_CAPSULE | Freq: Two times a day (BID) | ORAL | 6 refills | Status: AC

## 2017-10-19 ENCOUNTER — Encounter (INDEPENDENT_AMBULATORY_CARE_PROVIDER_SITE_OTHER): Payer: BLUE CROSS/BLUE SHIELD | Admitting: Ophthalmology

## 2017-10-21 ENCOUNTER — Encounter (INDEPENDENT_AMBULATORY_CARE_PROVIDER_SITE_OTHER): Payer: BLUE CROSS/BLUE SHIELD | Admitting: Ophthalmology

## 2017-10-21 DIAGNOSIS — H4423 Degenerative myopia, bilateral: Secondary | ICD-10-CM | POA: Diagnosis not present

## 2017-10-21 DIAGNOSIS — H442A2 Degenerative myopia with choroidal neovascularization, left eye: Secondary | ICD-10-CM

## 2017-10-21 DIAGNOSIS — H43813 Vitreous degeneration, bilateral: Secondary | ICD-10-CM | POA: Diagnosis not present

## 2018-04-21 ENCOUNTER — Encounter (INDEPENDENT_AMBULATORY_CARE_PROVIDER_SITE_OTHER): Payer: BLUE CROSS/BLUE SHIELD | Admitting: Ophthalmology

## 2018-12-25 ENCOUNTER — Other Ambulatory Visit: Payer: Self-pay | Admitting: Internal Medicine

## 2020-03-06 ENCOUNTER — Encounter (INDEPENDENT_AMBULATORY_CARE_PROVIDER_SITE_OTHER): Payer: BC Managed Care – PPO | Admitting: Ophthalmology

## 2020-03-06 ENCOUNTER — Other Ambulatory Visit: Payer: Self-pay

## 2020-03-06 DIAGNOSIS — H43813 Vitreous degeneration, bilateral: Secondary | ICD-10-CM

## 2020-03-06 DIAGNOSIS — H442A2 Degenerative myopia with choroidal neovascularization, left eye: Secondary | ICD-10-CM

## 2020-03-06 DIAGNOSIS — H4423 Degenerative myopia, bilateral: Secondary | ICD-10-CM

## 2021-05-07 ENCOUNTER — Encounter (INDEPENDENT_AMBULATORY_CARE_PROVIDER_SITE_OTHER): Payer: BC Managed Care – PPO | Admitting: Ophthalmology

## 2021-05-07 ENCOUNTER — Other Ambulatory Visit: Payer: Self-pay

## 2021-05-07 DIAGNOSIS — H442A2 Degenerative myopia with choroidal neovascularization, left eye: Secondary | ICD-10-CM | POA: Diagnosis not present

## 2021-05-07 DIAGNOSIS — H43813 Vitreous degeneration, bilateral: Secondary | ICD-10-CM

## 2021-05-07 DIAGNOSIS — H4421 Degenerative myopia, right eye: Secondary | ICD-10-CM

## 2022-05-06 ENCOUNTER — Encounter (INDEPENDENT_AMBULATORY_CARE_PROVIDER_SITE_OTHER): Payer: BC Managed Care – PPO | Admitting: Ophthalmology

## 2022-06-16 ENCOUNTER — Encounter (INDEPENDENT_AMBULATORY_CARE_PROVIDER_SITE_OTHER): Payer: BC Managed Care – PPO | Admitting: Ophthalmology

## 2022-06-17 ENCOUNTER — Encounter (INDEPENDENT_AMBULATORY_CARE_PROVIDER_SITE_OTHER): Payer: BC Managed Care – PPO | Admitting: Ophthalmology

## 2022-06-17 DIAGNOSIS — H4423 Degenerative myopia, bilateral: Secondary | ICD-10-CM

## 2022-06-17 DIAGNOSIS — H43813 Vitreous degeneration, bilateral: Secondary | ICD-10-CM | POA: Diagnosis not present

## 2023-06-16 ENCOUNTER — Encounter (INDEPENDENT_AMBULATORY_CARE_PROVIDER_SITE_OTHER): Payer: BC Managed Care – PPO | Admitting: Ophthalmology
